# Patient Record
Sex: Female | Born: 1995 | Race: Black or African American | Hispanic: No | Marital: Single | Smoking: Never smoker
Health system: Southern US, Community
[De-identification: ages and names within clinical notes are randomized; demographics above are authoritative.]

## PROBLEM LIST (undated history)

## (undated) ENCOUNTER — Ambulatory Visit (HOSPITAL_COMMUNITY): Admission: EM | Payer: MEDICAID

## (undated) DIAGNOSIS — A599 Trichomoniasis, unspecified: Secondary | ICD-10-CM

## (undated) DIAGNOSIS — R87629 Unspecified abnormal cytological findings in specimens from vagina: Secondary | ICD-10-CM

## (undated) DIAGNOSIS — R519 Headache, unspecified: Secondary | ICD-10-CM

## (undated) DIAGNOSIS — R0600 Dyspnea, unspecified: Secondary | ICD-10-CM

## (undated) DIAGNOSIS — D649 Anemia, unspecified: Secondary | ICD-10-CM

## (undated) DIAGNOSIS — J45909 Unspecified asthma, uncomplicated: Secondary | ICD-10-CM

## (undated) DIAGNOSIS — A749 Chlamydial infection, unspecified: Secondary | ICD-10-CM

## (undated) HISTORY — PX: WISDOM TOOTH EXTRACTION: SHX21

---

## 2015-11-20 ENCOUNTER — Emergency Department (HOSPITAL_COMMUNITY)
Admission: EM | Admit: 2015-11-20 | Discharge: 2015-11-20 | Discharge status: Absent without leave | Payer: Self-pay | Source: Home / Self Care

## 2016-07-14 ENCOUNTER — Ambulatory Visit (HOSPITAL_COMMUNITY)
Admission: EM | Admit: 2016-07-14 | Discharge: 2016-07-14 | Disposition: A | Discharge status: Home / Self Care | Payer: 59 | Attending: Family Medicine | Admitting: Family Medicine

## 2016-07-14 ENCOUNTER — Encounter (HOSPITAL_COMMUNITY): Payer: Self-pay | Admitting: Emergency Medicine

## 2016-07-14 DIAGNOSIS — E860 Dehydration: Secondary | ICD-10-CM

## 2016-07-14 HISTORY — DX: Unspecified asthma, uncomplicated: J45.909

## 2016-07-14 LAB — POCT URINALYSIS DIP (DEVICE)
Glucose, UA: NEGATIVE mg/dL
Hgb urine dipstick: NEGATIVE
LEUKOCYTES UA: NEGATIVE
NITRITE: NEGATIVE
Protein, ur: 30 mg/dL — AB
Specific Gravity, Urine: 1.025 (ref 1.005–1.030)
Urobilinogen, UA: >=8 mg/dL (ref 0.0–1.0)
pH: 6 (ref 5.0–8.0)

## 2016-07-14 LAB — POCT I-STAT, CHEM 8
BUN: 7 mg/dL (ref 6–20)
CHLORIDE: 100 mmol/L — AB (ref 101–111)
Calcium, Ion: 1.19 mmol/L (ref 1.15–1.40)
Creatinine, Ser: 0.5 mg/dL (ref 0.44–1.00)
Glucose, Bld: 96 mg/dL (ref 65–99)
HEMATOCRIT: 40 % (ref 36.0–46.0)
HEMOGLOBIN: 13.6 g/dL (ref 12.0–15.0)
POTASSIUM: 3.5 mmol/L (ref 3.5–5.1)
Sodium: 139 mmol/L (ref 135–145)
TCO2: 25 mmol/L (ref 0–100)

## 2016-07-14 LAB — POCT PREGNANCY, URINE: PREG TEST UR: NEGATIVE

## 2016-07-14 NOTE — ED Notes (Signed)
Handed to dr Jarvis Newcomergrunz

## 2016-07-14 NOTE — Discharge Instructions (Signed)
You probably fainted due to dehydration. Your EKG is normal and other lab work was negative, so it is important for you to maintain good hydration status and follow up if this occurs again, especially if you have an episode during exertion.

## 2016-07-14 NOTE — ED Triage Notes (Signed)
Patient reports a syncopal episode today while in clinical.  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  Patient says there was nothing going on: no procedure, task, etc.  Patient says she hyperventilated prior to passing out.  Patient says she ate breakfast this morning: eggs, bacon, fruit cup.  Patient reports nausea and vomiting all day yesterday.    Patient stopped depo shots in December 2017.  Patient has not had a period since then.  Patient is sexually active and reports she uses condoms consistently.

## 2016-07-14 NOTE — ED Provider Notes (Signed)
[facility]    CSN: [id number] Arrival date & time: 07/14/16  1011  First Provider Contact:  First MD Initiated Contact with Patient 07/14/16 1107  History   Chief Complaint Chief Complaint  Patient presents with  . Abdominal Pain  . Loss of Consciousness   HPI Veronica Tran is a 20 y.o. female presenting with fainting spells.   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 She was about to walk into a room and felt lightheaded, hyperventilated and lost consciousness momentarily. She fell onto the floor slowly without hard contact with the ground because someone caught her. No seizure like activity, urinary incontinence, tongue biting, or postictal symptoms. She denies palpitations, dyspnea, chest pain. She had some nausea and poor po with one episode of emesis yesterday and thinks she might be dehydrated.   She's had 2 similar episodes previously over the past few months, both in the setting of poor po, none during exertion. No family history of sudden cardiac death or premature CV disease. No personal history of arrhythmia or congenital heart disease.   Past Medical History:  Diagnosis Date  . Asthma    There are no active problems to display for this patient.  History reviewed. No pertinent surgical history.  OB History    No data available     Home Medications    Prior to Admission medications   Not on File   Family History Family History  Problem Relation Age of Onset  . Hypertension Other   . Asthma Other    Social History Social History  Substance Use Topics  . Smoking status: Never Smoker  . Smokeless tobacco: Never Used  . Alcohol use No   Allergies   Review of patient's allergies indicates no known allergies.  Review of Systems Review of Systems Per HPI  Physical Exam Triage Vital Signs ED Triage Vitals  Enc Vitals Group     BP 07/14/16 1109 112/74     Pulse Rate 07/14/16 1109 94   Resp 07/14/16 1109 16     Temp 07/14/16 1109 98.3 F (36.8 C)     Temp Source 07/14/16 1109 Oral     SpO2 07/14/16 1109 100 %     Weight --      Height --      Head Circumference --      Peak Flow --      Pain Score 07/14/16 1128 4     Pain Loc --      Pain Edu? --      Excl. in GC? --    No data found.   Updated Vital Signs BP 112/74 (BP Location: Left Arm)   Pulse 94   Temp 98.3 F (36.8 C) (Oral)   Resp 16   LMP 10/14/2015 Comment: need review  SpO2 100%   Visual Acuity Right Eye Distance:   Left Eye Distance:   Bilateral Distance:    Right Eye Near:   Left Eye Near:    Bilateral Near:     Physical Exam  Constitutional: She is oriented to person, place, and time. She appears well-developed and well-nourished. No distress.  HENT:  Head: Normocephalic and atraumatic.  Mouth/Throat: No oropharyngeal exudate.  tacky mucous membranes  Eyes: Conjunctivae are normal. Pupils are equal, round, and reactive to light.  Neck: Normal range of motion. Neck supple. No JVD present.  Cardiovascular: Normal rate, regular rhythm, normal heart sounds and intact distal  pulses.  Exam reveals no gallop and no friction rub.   No murmur heard. Pulmonary/Chest: Effort normal and breath sounds normal. No respiratory distress.  Abdominal: Soft. Bowel sounds are normal. She exhibits no distension. There is no tenderness.  Musculoskeletal: She exhibits no edema or deformity.  Lymphadenopathy:    She has no cervical adenopathy.  Neurological: She is alert and oriented to person, place, and time. No cranial nerve deficit. She exhibits normal muscle tone.  Skin: Skin is warm. Capillary refill takes less than 2 seconds.  Psychiatric: She has a normal mood and affect. Her behavior is normal.     UC Treatments / Results  Labs (all labs ordered are listed, but only abnormal results are displayed) Labs Reviewed  POCT URINALYSIS DIP (DEVICE) - Abnormal; Notable for the following:        Result Value   Bilirubin Urine SMALL (*)    Ketones, ur TRACE (*)    Protein, ur 30 (*)    All other components within normal limits  POCT I-STAT, CHEM 8 - Abnormal; Notable for the following:    Chloride 100 (*)    All other components within normal limits  POCT PREGNANCY, URINE   EKG  EKG Interpretation None      ECG performed, NSR 90bpm with normal axis and intervals, normal RWP without LVH. T waves concordant without ST-T changes.   Radiology No results found.  Procedures Procedures (including critical care time)  Medications Ordered in UC Medications - No data to display   Initial Impression / Assessment and Plan / UC Course  I have reviewed the triage vital signs and the nursing notes.  Pertinent labs & imaging results that were available during my care of the patient were reviewed by me and considered in my medical decision making (see chart for details).   Final Clinical Impressions(s) / UC Diagnoses   Final diagnoses:  Dehydration   Previously healthy 20 y.o. female with fainting spell in setting of poor po consistent with dehydration vs. vasovagal syncope.   Ketones in urine and light-headedness with sitting from laying on exam consistent with dehydration. Electrolytes, Cr normal, no anemia. UPT neg. Cardiac exam normal, ECG reviewed without evidence of accessory pathway or changes indicative of HOCM.  Neg FH SCD.  - Advised to maintain hydration and return if symptoms recur or occur with exertion.   New Prescriptions New Prescriptions   No medications on file     Tyrone Nineyan B Jocilynn Grade, MD 07/14/16 1217

## 2018-07-21 ENCOUNTER — Other Ambulatory Visit: Payer: 59

## 2018-07-21 ENCOUNTER — Encounter: Payer: Self-pay | Admitting: Family

## 2018-07-21 ENCOUNTER — Encounter (INDEPENDENT_AMBULATORY_CARE_PROVIDER_SITE_OTHER): Payer: Self-pay

## 2018-07-21 ENCOUNTER — Ambulatory Visit: Payer: 59 | Admitting: Family

## 2018-07-21 VITALS — BP 102/78 | HR 102 | Temp 98.0°F | Ht 66.0 in | Wt 148.0 lb

## 2018-07-21 DIAGNOSIS — J45909 Unspecified asthma, uncomplicated: Secondary | ICD-10-CM | POA: Insufficient documentation

## 2018-07-21 DIAGNOSIS — J4520 Mild intermittent asthma, uncomplicated: Secondary | ICD-10-CM | POA: Diagnosis not present

## 2018-07-21 DIAGNOSIS — R300 Dysuria: Secondary | ICD-10-CM

## 2018-07-21 LAB — POC URINALSYSI DIPSTICK (AUTOMATED)
Bilirubin, UA: NEGATIVE
Glucose, UA: NEGATIVE
Ketones, UA: NEGATIVE
Nitrite, UA: NEGATIVE
PROTEIN UA: NEGATIVE
RBC UA: NEGATIVE
UROBILINOGEN UA: 0.2 U/dL
pH, UA: 5.5 (ref 5.0–8.0)

## 2018-07-21 MED ORDER — SULFAMETHOXAZOLE-TRIMETHOPRIM 800-160 MG PO TABS
1.0000 | ORAL_TABLET | Freq: Two times a day (BID) | ORAL | 0 refills | Status: DC
Start: 2018-07-21 — End: 2019-01-25

## 2018-07-21 NOTE — Addendum Note (Signed)
Addended by: Radford PaxAIRRIKIER DAVIDSON, Ashelynn Marks M on: 07/21/2018 10:53 AM   Modules accepted: Orders

## 2018-07-21 NOTE — Progress Notes (Signed)
  Veronica Tran is a 22 y.o. female with the following history as recorded in EpicCare:  Patient Active Problem List   Diagnosis Date Noted  . Childhood asthma 07/21/2018    Current Outpatient Medications  Medication Sig Dispense Refill  . albuterol (PROVENTIL HFA;VENTOLIN HFA) 108 (90 Base) MCG/ACT inhaler Inhale into the lungs every 6 (six) hours as needed.    . sulfamethoxazole-trimethoprim (BACTRIM DS,SEPTRA DS) 800-160 MG tablet Take 1 tablet by mouth 2 (two) times daily. 10 tablet 0   No current facility-administered medications for this visit.     Allergies: Patient has no known allergies.  Past Medical History:  Diagnosis Date  . Asthma     History reviewed. No pertinent surgical history.  Family History  Problem Relation Age of Onset  . Hypertension Mother   . Cancer Mother   . Asthma Father   . Asthma Sister   . Osteoarthritis Maternal Grandmother   . Diabetes Maternal Grandmother   . Hypertension Maternal Grandmother   . Osteoarthritis Paternal Grandmother   . Hypertension Paternal Grandmother   . Hypertension Paternal Grandfather   . Asthma Paternal Grandfather   . Osteoarthritis Paternal Grandfather     Social History   Tobacco Use  . Smoking status: Never Smoker  . Smokeless tobacco: Never Used  Substance Use Topics  . 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  Head: Normocephalic  and atraumatic  Lungs: Respirations unlabored; clear to auscultation bilaterally without wheeze, rales, rhonchi  CVS exam: normal rate and regular rhythm.  Abdomen: Soft; nontender; nondistended; normoactive bowel sounds; no masses or hepatosplenomegaly  Musculoskeletal: No deformities; no active joint inflammation; negative CVA tenderness Neurologic: Alert and oriented; speech intact; face symmetrical; moves all extremities well; CNII-XII intact without focal deficit   Assessment:  1. Dysuria   2. 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    Return for pap smear/ flu/ PPD.  Orders Placed This Encounter  Procedures  . POCT Urinalysis Dipstick (Automated)    Requested Prescriptions   Signed Prescriptions Disp Refills  . sulfamethoxazole-trimethoprim (BACTRIM DS,SEPTRA DS) 800-160 MG tablet 10 tablet 0    Sig: Take 1 tablet by mouth 2 (two) times daily.

## 2018-07-23 LAB — CULTURE, URINE COMPREHENSIVE
MICRO NUMBER: 91132208
RESULT:: NO GROWTH
SPECIMEN QUALITY:: ADEQUATE

## 2018-07-25 ENCOUNTER — Ambulatory Visit: Payer: 59 | Admitting: Family

## 2018-07-26 ENCOUNTER — Encounter: Payer: Self-pay | Admitting: Family

## 2018-07-26 ENCOUNTER — Ambulatory Visit (INDEPENDENT_AMBULATORY_CARE_PROVIDER_SITE_OTHER): Payer: 59 | Admitting: Family

## 2018-07-26 VITALS — BP 104/78 | HR 93 | Temp 98.1°F | Ht 66.0 in | Wt 143.6 lb

## 2018-07-26 DIAGNOSIS — Z111 Encounter for screening for respiratory tuberculosis: Secondary | ICD-10-CM | POA: Diagnosis not present

## 2018-07-26 DIAGNOSIS — Z23 Encounter for immunization: Secondary | ICD-10-CM

## 2018-07-26 DIAGNOSIS — Z3042 Encounter for surveillance of injectable contraceptive: Secondary | ICD-10-CM | POA: Diagnosis not present

## 2018-07-26 MED ORDER — MEDROXYPROGESTERONE ACETATE 150 MG/ML IM SUSP: 150.0000 mg | Freq: Once | INTRAMUSCULAR | 0 refills | Status: DC

## 2018-07-26 NOTE — Addendum Note (Signed)
Addended by: Karma Ganja on: 07/26/2018 03:03 PM   Modules accepted: Orders

## 2018-07-26 NOTE — Progress Notes (Signed)
  Veronica Tran is a 22 y.o. female with the following history as recorded in EpicCare:  Patient Active Problem List   Diagnosis Date Noted  . Childhood asthma 07/21/2018    Current Outpatient Medications  Medication Sig Dispense Refill  . albuterol (PROVENTIL HFA;VENTOLIN HFA) 108 (90 Base) MCG/ACT inhaler Inhale into the lungs every 6 (six) hours as needed.    . sulfamethoxazole-trimethoprim (BACTRIM DS,SEPTRA DS) 800-160 MG tablet Take 1 tablet by mouth 2 (two) times daily. 10 tablet 0  . medroxyPROGESTERone (DEPO-PROVERA) 150 MG/ML injection Inject 1 mL (150 mg total) into the muscle once for 1 dose. 1 mL 0   No current facility-administered medications for this visit.     Allergies: Patient has no known allergies.  Past Medical History:  Diagnosis Date  . Asthma     History reviewed. No pertinent surgical history.  Family History  Problem Relation Age of Onset  . Hypertension Mother   . Cancer Mother   . Asthma Father   . Asthma Sister   . Osteoarthritis Maternal Grandmother   . Diabetes Maternal Grandmother   . Hypertension Maternal Grandmother   . Osteoarthritis Paternal Grandmother   . Hypertension Paternal Grandmother   . Hypertension Paternal Grandfather   . Asthma Paternal Grandfather   . Osteoarthritis Paternal Grandfather     Social History   Tobacco Use  . Smoking status: Never Smoker  . Smokeless tobacco: Never Used  Substance Use Topics  . Alcohol use: No    Subjective:  Patient was originally scheduled for pap smear today in order to start her Depo; notes that her period started today- would prefer to do pap on Friday; Needs flu and PPD today; will return on Friday for PPD read;   Objective:  Vitals:   07/26/18 1331  BP: 104/78  Pulse: 93  Temp: 98.1 F (36.7 C)  TempSrc: Oral  SpO2: 99%  Weight: 143 lb 9.6 oz (65.1 kg)  Height: 5\' 6"  (1.676 m)    General: Well developed, well nourished, in no acute distress  Skin : Warm and dry.  Head:  Normocephalic and atraumatic  Lungs: Respirations unlabored; clear to auscultation bilaterally without wheeze, rales, rhonchi  Neurologic: Alert and oriented; speech intact; face symmetrical; moves all extremities well; CNII-XII intact without focal deficit   Assessment:  1. Encounter for Depo-Provera contraception     Plan:  Flu shot given today;  PPD placed- return in 48 hours to have read;  She will pap smear and Depo-Medrol on Friday.   Return in about 2 days (around 07/28/2018) for pap smear.  No orders of the defined types were placed in this encounter.   Requested Prescriptions   Signed Prescriptions Disp Refills  . medroxyPROGESTERone (DEPO-PROVERA) 150 MG/ML injection 1 mL 0    Sig: Inject 1 mL (150 mg total) into the muscle once for 1 dose.

## 2018-07-28 ENCOUNTER — Ambulatory Visit: Payer: 59 | Admitting: Family

## 2018-07-28 ENCOUNTER — Encounter: Payer: Self-pay | Admitting: Family

## 2018-07-28 VITALS — BP 102/70 | HR 83 | Temp 97.9°F | Ht 66.0 in | Wt 145.0 lb

## 2018-07-28 DIAGNOSIS — Z789 Other specified health status: Secondary | ICD-10-CM | POA: Diagnosis not present

## 2018-07-28 DIAGNOSIS — IMO0001 Reserved for inherently not codable concepts without codable children: Secondary | ICD-10-CM

## 2018-07-28 LAB — TB SKIN TEST
Induration: 0 mm
TB Skin Test: NEGATIVE

## 2018-07-28 MED ORDER — MEDROXYPROGESTERONE ACETATE 150 MG/ML IM SUSY
150.0000 mg | PREFILLED_SYRINGE | Freq: Once | INTRAMUSCULAR | Status: AC
  Administered 2018-07-28: 150 mg via INTRAMUSCULAR

## 2018-07-28 NOTE — Patient Instructions (Signed)
You need a pap smear before your next Depo; you will need a Depo in 3 months;

## 2018-07-28 NOTE — Progress Notes (Signed)
  Veronica Tran is a 22 y.o. female with the following history as recorded in EpicCare:  Patient Active Problem List   Diagnosis Date Noted  . Childhood asthma 07/21/2018    Current Outpatient Medications  Medication Sig Dispense Refill  . albuterol (PROVENTIL HFA;VENTOLIN HFA) 108 (90 Base) MCG/ACT inhaler Inhale into the lungs every 6 (six) hours as needed.    . medroxyPROGESTERone (DEPO-PROVERA) 150 MG/ML injection     . sulfamethoxazole-trimethoprim (BACTRIM DS,SEPTRA DS) 800-160 MG tablet Take 1 tablet by mouth 2 (two) times daily. 10 tablet 0  . medroxyPROGESTERone (DEPO-PROVERA) 150 MG/ML injection Inject 1 mL (150 mg total) into the muscle once for 1 dose. 1 mL 0   No current facility-administered medications for this visit.     Allergies: Patient has no known allergies.  Past Medical History:  Diagnosis Date  . Asthma     History reviewed. No pertinent surgical history.  Family History  Problem Relation Age of Onset  . Hypertension Mother   . Cancer Mother   . Asthma Father   . Asthma Sister   . Osteoarthritis Maternal Grandmother   . Diabetes Maternal Grandmother   . Hypertension Maternal Grandmother   . Osteoarthritis Paternal Grandmother   . Hypertension Paternal Grandmother   . Hypertension Paternal Grandfather   . Asthma Paternal Grandfather   . Osteoarthritis Paternal Grandfather     Social History   Tobacco Use  . Smoking status: Never Smoker  . Smokeless tobacco: Never Used  Substance Use Topics  . Alcohol use: No    Subjective:  Patient's appointment was originally scheduled for  1) PPD reading; 2) Depo-Provera ( currently on her period); 3) updating her pap smear; Patient notes her period is still heavier than she had planned and prefers not to update pap smear today;    Objective:  Vitals:   07/28/18 1359  BP: 102/70  Pulse: 83  Temp: 97.9 F (36.6 C)  TempSrc: Oral  SpO2: 99%  Weight: 145 lb 0.6 oz (65.8 kg)  Height: 5\' 6"  (1.676 m)     General: Well developed, well nourished, in no acute distress  Skin : Warm and dry.  Head: Normocephalic and atraumatic  Lungs: Respirations unlabored; Neurologic: Alert and oriented; speech intact; face symmetrical; moves all extremities well; CNII-XII intact without focal deficit  Assessment:  1. Birth control     Plan:  Depo-Provera 150 mg given today- patient did not bring medication with her; she understands that pap smear needs to be updated before next Depo-Provera is due;  PPD is negative;    Return in about 3 months (around 10/27/2018).  No orders of the defined types were placed in this encounter.   Requested Prescriptions    No prescriptions requested or ordered in this encounter

## 2018-08-18 ENCOUNTER — Ambulatory Visit: Payer: 59 | Admitting: Family

## 2018-08-18 DIAGNOSIS — Z0289 Encounter for other administrative examinations: Secondary | ICD-10-CM

## 2018-08-22 ENCOUNTER — Ambulatory Visit: Payer: 59 | Admitting: Family

## 2018-08-22 DIAGNOSIS — Z0289 Encounter for other administrative examinations: Secondary | ICD-10-CM

## 2018-10-23 ENCOUNTER — Ambulatory Visit: Payer: 59

## 2019-01-25 ENCOUNTER — Other Ambulatory Visit: Payer: Self-pay

## 2019-01-25 ENCOUNTER — Ambulatory Visit: Payer: 59 | Admitting: Internal Medicine

## 2019-01-25 ENCOUNTER — Telehealth: Payer: Self-pay | Admitting: *Deleted

## 2019-01-25 ENCOUNTER — Encounter: Payer: Self-pay | Admitting: Internal Medicine

## 2019-01-25 VITALS — BP 100/60 | HR 102 | Temp 98.3°F | Ht 66.0 in | Wt 144.0 lb

## 2019-01-25 DIAGNOSIS — R300 Dysuria: Secondary | ICD-10-CM | POA: Diagnosis not present

## 2019-01-25 DIAGNOSIS — Z30011 Encounter for initial prescription of contraceptive pills: Secondary | ICD-10-CM

## 2019-01-25 DIAGNOSIS — Z309 Encounter for contraceptive management, unspecified: Secondary | ICD-10-CM | POA: Insufficient documentation

## 2019-01-25 LAB — POCT URINALYSIS DIPSTICK
BILIRUBIN UA: NEGATIVE
Glucose, UA: NEGATIVE
KETONES UA: NEGATIVE
Nitrite, UA: NEGATIVE
PH UA: 6 (ref 5.0–8.0)
Protein, UA: POSITIVE — AB
Spec Grav, UA: 1.025 (ref 1.010–1.025)
UROBILINOGEN UA: 2 U/dL — AB

## 2019-01-25 LAB — POCT URINE PREGNANCY: Preg Test, Ur: NEGATIVE

## 2019-01-25 MED ORDER — LEVONORGEST-ETH ESTRAD 91-DAY 0.15-0.03 &0.01 MG PO TABS: 1.0000 | ORAL_TABLET | Freq: Every day | ORAL | 1 refills | Status: DC

## 2019-01-25 MED ORDER — NITROFURANTOIN MONOHYD MACRO 100 MG PO CAPS: 100.0000 mg | ORAL_CAPSULE | Freq: Two times a day (BID) | ORAL | 0 refills | Status: DC

## 2019-01-25 NOTE — Patient Instructions (Signed)
We have sent in birth control pills to start taking. Usually this is recommended to start when you period starts and then take every day at the same time.   You can choose to start taking now but you may notice irregular bleeding for the first several months. Use second form of protection for the first 2-3 weeks of taking birth control.   We have sent in a medication for the urine infection called macrobid. Take 1 pill twice a day for 1 week to clear the infection.    Oral Contraception Information Oral contraceptive pills (OCPs) are medicines taken to prevent pregnancy. OCPs are taken by mouth, and they work by:  Preventing the ovaries from releasing eggs.  Thickening mucus in the lower part of the uterus (cervix), which prevents sperm from entering the uterus.  Thinning the lining of the uterus (endometrium), which prevents a fertilized egg from attaching to the endometrium. OCPs are highly effective when taken exactly as prescribed. However, OCPs do not prevent STIs (sexually transmitted infections). Safe sex practices, such as using condoms while on an OCP, can help prevent STIs. Before starting OCPs Before you start taking OCPs, you may have a physical exam, blood test, and Pap test. However, you are not required to have a pelvic exam in order to be prescribed OCPs. Your health care provider will make sure you are a good candidate for oral contraception. OCPs are not a good option for certain women, including women who smoke and are older than 35 years, and women with a medical history of high blood pressure, deep vein thrombosis, pulmonary embolism, stroke, cardiovascular disease, or peripheral vascular disease. Discuss with your health care provider the possible side effects of the OCP you may be prescribed. When you start an OCP, be aware that it can take 2-3 months for your body to adjust to changes in hormone levels. Follow instructions from your health care provider about how to start  taking your first cycle of OCPs. Depending on when you start the pill, you may need to use a backup form of birth control, such as condoms, during the first week. Make sure you know what steps to take if you ever forget to take the pill. Types of oral contraception  The most common types of birth control pills contain the hormones estrogen and progestin (synthetic progesterone) or progestin only. The combination pill This type of pill contains estrogen and progestin hormones. Combination pills often come in packs of 21, 28, or 91 pills. For each pack, the last 7 pills may not contain hormones, which means you may stop taking the pills for 7 days. Menstrual bleeding occurs during the week that you do not take the pills or that you take the pills with no hormones in them. The minipill This type of pill contains the progestin hormone only. It comes in packs of 28 pills. All 28 pills contain the hormone. You take the pill every day. It is very important to take the pill at the same time each day. Advantages of oral contraceptive pills  Provides reliable and continuous contraception if taken as instructed.  May treat or decrease symptoms of: ? Menstrual period cramps. ? Irregular menstrual cycle or bleeding. ? Heavy menstrual flow. ? Abnormal uterine bleeding. ? Acne, depending on the type of pill. ? Polycystic ovarian syndrome. ? Endometriosis. ? Iron deficiency anemia. ? Premenstrual symptoms, including premenstrual dysphoric disorder.  May reduce the risk of endometrial and ovarian cancer.  Can be used as emergency  contraception.  Prevents mislocated (ectopic) pregnancies and infections of the fallopian tubes. Things that can make oral contraceptive pills less effective OCPs can be less effective if:  You forget to take the pill at the same time every day. This is especially important when taking the minipill.  You have a stomach or intestinal disease that reduces your body's ability  to absorb the pill.  You take OCPs with other medicines that make OCPs less effective, such as antibiotics, certain HIV medicines, and some seizure medicines.  You take expired OCPs.  You forget to restart the pill on day 7, if using the packs of 21 pills. Risks associated with oral contraceptive pills Oral contraceptive pills can sometimes cause side effects, such as:  Headache.  Depression.  Trouble sleeping.  Nausea and vomiting.  Breast tenderness.  Irregular bleeding or spotting during the first several months.  Bloating or fluid retention.  Increase in blood pressure. Combination pills are also associated with a small increase in the risk of:  Blood clots.  Heart attack.  Stroke. Summary  Oral contraceptive pills are medicines taken by mouth to prevent pregnancy. They are highly effective when taken exactly as prescribed.  The most common types of birth control pills contain the hormones estrogen and progestin (synthetic progesterone) or progestin only.  Before you start taking the pill, you may have a physical exam, blood test, and Pap test. Your health care provider will make sure you are a good candidate for oral contraception.  The combination pill may come in a 21-day pack, a 28-day pack, or a 91-day pack. The minipill contains the progesterone hormone only and comes in packs of 28 pills.  Oral contraceptive pills can sometimes cause side effects, such as headache, nausea, breast tenderness, or irregular bleeding. This information is not intended to replace advice given to you by your health care provider. Make sure you discuss any questions you have with your health care provider. Document Released: 01/08/2003 Document Revised: 01/11/2017 Document Reviewed: 01/11/2017 Elsevier Interactive Patient Education  2019 [employer].

## 2019-01-25 NOTE — Assessment & Plan Note (Signed)
Pregnancy test done in office since 2 weeks since cycle and not using protection. Advised of start timeline as well as need to take daily for birth control. Advised that this does not protect from other STDs. She elects to take continuous active ingredient.

## 2019-01-25 NOTE — Progress Notes (Signed)
   Subjective:   Patient ID: Veronica Tran, female    DOB: Mar 18, 1996, 23 y.o.   MRN: [id number]  HPI The patient is a 23 YO female coming in for concerns about urgency with urination. Started a couple of weeks ago and has been off and on. LMP 2 weeks ago and was on depo-provera for birth control and last injection in September and not doing anything for birth control since that time and is sexually active. She does also want to start new birth control and would prefer a daily pill. She has taken oral birth control pills earlier in life more to regulate cycle without problems. Denies fevers or chills. Denies abdominal pain. Mild side pain which worried her. Denies nausea or vomiting.   Review of Systems  Constitutional: Negative.   Respiratory: Negative.   Cardiovascular: Negative.   Gastrointestinal: Positive for abdominal pain. Negative for abdominal distention, constipation, diarrhea, nausea and vomiting.  Genitourinary: Positive for dysuria, frequency and urgency.  Musculoskeletal: Negative.   Skin: Negative.     Objective:  Physical Exam Constitutional:      Appearance: She is well-developed.  HENT:     Head: Normocephalic and atraumatic.  Neck:     Musculoskeletal: Normal range of motion.  Cardiovascular:     Rate and Rhythm: Normal rate and regular rhythm.  Pulmonary:     Effort: Pulmonary effort is normal. No respiratory distress.     Breath sounds: Normal breath sounds. No wheezing or rales.  Abdominal:     General: Bowel sounds are normal. There is no distension.     Palpations: Abdomen is soft.     Tenderness: There is abdominal tenderness. There is no rebound.     Comments: Mild right flank tenderness  Skin:    General: Skin is warm and dry.  Neurological:     Mental Status: She is alert and oriented to person, place, and time.     Coordination: Coordination normal.     Vitals:   01/25/19 1530  BP: 100/60  Pulse: (!) 102  Temp: 98.3 F (36.8 C)  TempSrc:  Oral  SpO2: 97%  Weight: 144 lb (65.3 kg)  Height: 5\' 6"  (1.676 m)    Assessment & Plan:  Visit time 25 minutes: greater than 50% of that time was spent in face to face counseling and coordination of care with the patient: counseled about birth control options as well as urinary tract infection and symptoms

## 2019-01-25 NOTE — Telephone Encounter (Signed)
Called/screened patient re: 01/25/19 OV with Dr. Okey Dupre and due to COVID-19 pandemic.  Pt denies cough, SOB, fever, N&V, diarrhea,  HA. I advised pt to keep OV today and come alone. Pt verbalized understanding.

## 2019-01-26 DIAGNOSIS — R300 Dysuria: Secondary | ICD-10-CM | POA: Insufficient documentation

## 2019-01-26 NOTE — Assessment & Plan Note (Signed)
U/A and pregnancy test done in the office which were positive for signs of infection. Negative for pregnancy. Rx for macrobid for infection.

## 2019-03-09 ENCOUNTER — Encounter: Payer: Self-pay | Admitting: Internal Medicine

## 2019-03-09 ENCOUNTER — Ambulatory Visit (INDEPENDENT_AMBULATORY_CARE_PROVIDER_SITE_OTHER): Payer: 59 | Admitting: Internal Medicine

## 2019-03-09 ENCOUNTER — Other Ambulatory Visit: Payer: Self-pay

## 2019-03-09 DIAGNOSIS — R300 Dysuria: Secondary | ICD-10-CM

## 2019-03-09 DIAGNOSIS — Z30011 Encounter for initial prescription of contraceptive pills: Secondary | ICD-10-CM

## 2019-03-09 MED ORDER — SULFAMETHOXAZOLE-TRIMETHOPRIM 800-160 MG PO TABS: 1.0000 | ORAL_TABLET | Freq: Two times a day (BID) | ORAL | 0 refills | Status: DC

## 2019-03-09 MED ORDER — NORGESTIM-ETH ESTRAD TRIPHASIC 0.18/0.215/0.25 MG-25 MCG PO TABS: 1.0000 | ORAL_TABLET | Freq: Every day | ORAL | 3 refills | Status: DC

## 2019-03-09 NOTE — Progress Notes (Signed)
Virtual Visit via Video Note  I connected with Veronica Tran on 03/09/19 at  3:40 PM EDT by a video enabled telemedicine application and verified that I am speaking with the correct person using two identifiers.  The patient and the provider were at separate locations throughout the entire encounter.   I discussed the limitations of evaluation and management by telemedicine and the availability of in person appointments. The patient expressed understanding and agreed to proceed.  History of Present Illness: The patient is a 23 y.o. female with visit for possible UTI. Recently treated for UTI back in late March. She took macrobid and symptoms went all the way away. LMP 01/17/19. Started in the last 2 weeks and the symptoms are intermittent with pain on going. Denies discharge or blood in urine. No stomach or back pain. Denies fevers or chills or nausea or vomiting. Overall it is stable. Has tried drinking more fluids. No new sexual partners. She has no known risks for STDs but not using any protection and is sexually active.  Observations/Objective: Appearance: normal, breathing appears normal, casual grooming, abdomen does not appear distended, throat normal, mental status is A and O times 3  Assessment and Plan: See problem oriented charting  Follow Up Instructions: rx bactrim and alternate OCP, asked to take home pregnancy test prior to initiation of OCP  I discussed the assessment and treatment plan with the patient. The patient was provided an opportunity to ask questions and all were answered. The patient agreed with the plan and demonstrated an understanding of the instructions.   The patient was advised to call back or seek an in-person evaluation if the symptoms worsen or if the condition fails to improve as anticipated.  Myrlene Broker, MD

## 2019-03-09 NOTE — Assessment & Plan Note (Signed)
She did not start due to cost. No birth control since that time. Denies risk of pregnancy but LMP 01/17/19 so asked her to do home pregnancy test prior to start. Having irregular cycles due to recently stopping depo-provera.

## 2019-03-09 NOTE — Assessment & Plan Note (Signed)
Rx for bactrim and if no relief needs U/A and culture and consideration for STD screening as no protection as well as urine pregnancy.

## 2019-04-20 ENCOUNTER — Other Ambulatory Visit (INDEPENDENT_AMBULATORY_CARE_PROVIDER_SITE_OTHER): Payer: 59

## 2019-04-20 ENCOUNTER — Ambulatory Visit (INDEPENDENT_AMBULATORY_CARE_PROVIDER_SITE_OTHER): Payer: 59 | Admitting: Family

## 2019-04-20 ENCOUNTER — Other Ambulatory Visit: Payer: Self-pay

## 2019-04-20 ENCOUNTER — Encounter: Payer: Self-pay | Admitting: Family

## 2019-04-20 VITALS — BP 104/68 | HR 112 | Temp 98.2°F | Ht 66.0 in | Wt 146.0 lb

## 2019-04-20 DIAGNOSIS — Z113 Encounter for screening for infections with a predominantly sexual mode of transmission: Secondary | ICD-10-CM

## 2019-04-20 DIAGNOSIS — R102 Pelvic and perineal pain: Secondary | ICD-10-CM

## 2019-04-20 DIAGNOSIS — N912 Amenorrhea, unspecified: Secondary | ICD-10-CM

## 2019-04-20 LAB — CBC WITH DIFFERENTIAL/PLATELET
Basophils Absolute: 0.1 10*3/uL (ref 0.0–0.1)
Basophils Relative: 0.5 % (ref 0.0–3.0)
Eosinophils Absolute: 0.1 10*3/uL (ref 0.0–0.7)
Eosinophils Relative: 0.8 % (ref 0.0–5.0)
HCT: 35.1 % — ABNORMAL LOW (ref 36.0–46.0)
Hemoglobin: 11.5 g/dL — ABNORMAL LOW (ref 12.0–15.0)
Lymphocytes Relative: 22.5 % (ref 12.0–46.0)
Lymphs Abs: 2.5 10*3/uL (ref 0.7–4.0)
MCHC: 32.7 g/dL (ref 30.0–36.0)
MCV: 83.9 fl (ref 78.0–100.0)
Monocytes Absolute: 0.9 10*3/uL (ref 0.1–1.0)
Monocytes Relative: 8.2 % (ref 3.0–12.0)
Neutro Abs: 7.6 10*3/uL (ref 1.4–7.7)
Neutrophils Relative %: 68 % (ref 43.0–77.0)
Platelets: 271 10*3/uL (ref 150.0–400.0)
RBC: 4.18 Mil/uL (ref 3.87–5.11)
RDW: 13.6 % (ref 11.5–15.5)
WBC: 11.1 10*3/uL — ABNORMAL HIGH (ref 4.0–10.5)

## 2019-04-20 LAB — COMPREHENSIVE METABOLIC PANEL
ALT: 45 U/L — ABNORMAL HIGH (ref 0–35)
AST: 22 U/L (ref 0–37)
Albumin: 4.2 g/dL (ref 3.5–5.2)
Alkaline Phosphatase: 81 U/L (ref 39–117)
BUN: 8 mg/dL (ref 6–23)
CO2: 25 mEq/L (ref 19–32)
Calcium: 9.2 mg/dL (ref 8.4–10.5)
Chloride: 106 mEq/L (ref 96–112)
Creatinine, Ser: 0.65 mg/dL (ref 0.40–1.20)
GFR: 136.22 mL/min (ref >60.00)
Glucose, Bld: 82 mg/dL (ref 70–99)
Potassium: 3.4 mEq/L — ABNORMAL LOW (ref 3.5–5.1)
Sodium: 138 mEq/L (ref 135–145)
Total Bilirubin: 0.4 mg/dL (ref 0.2–1.2)
Total Protein: 7 g/dL (ref 6.0–8.3)

## 2019-04-20 LAB — TSH: TSH: 0.85 u[IU]/mL (ref 0.35–4.50)

## 2019-04-20 NOTE — Progress Notes (Signed)
Veronica Tran is a 23 y.o. female with the following history as recorded in EpicCare:  Patient Active Problem List   Diagnosis Date Noted  . Dysuria 01/26/2019  . Encounter for birth control 01/25/2019  . Childhood asthma 07/21/2018    Current Outpatient Medications  Medication Sig Dispense Refill  . albuterol (PROVENTIL HFA;VENTOLIN HFA) 108 (90 Base) MCG/ACT inhaler Inhale into the lungs every 6 (six) hours as needed.    . Norgestimate-Ethinyl Estradiol Triphasic 0.18/0.215/0.25 MG-25 MCG tab Take 1 tablet by mouth daily. (Patient not taking: Reported on 04/20/2019) 4 Package 3   No current facility-administered medications for this visit.     Allergies: Patient has no known allergies.  Past Medical History:  Diagnosis Date  . Asthma     History reviewed. No pertinent surgical history.  Family History  Problem Relation Age of Onset  . Hypertension Mother   . Cancer Mother   . Asthma Father   . Asthma Sister   . Osteoarthritis Maternal Grandmother   . Diabetes Maternal Grandmother   . Hypertension Maternal Grandmother   . Osteoarthritis Paternal Grandmother   . Hypertension Paternal Grandmother   . Hypertension Paternal Grandfather   . Asthma Paternal Grandfather   . Osteoarthritis Paternal Grandfather     Social History   Tobacco Use  . Smoking status: Never Smoker  . Smokeless tobacco: Never Used  Substance Use Topics  . Alcohol use: No    Subjective:  Patient presents with concerns for pelvic pain x 6 months; notes she stopped the Depo-Provera in December 2019- had a normal period in March 2020; no period since that time; requesting STD screen today- had unprotected sex 3-4 weeks; took pregnancy test last week which was negative.  Has never had a pap smear/ not under care of GYN- no prior history of ovarian cysts; notes that the pelvic pain has been more intermittent recently;     Objective:  Vitals:   04/20/19 1520  BP: 104/68  Pulse: (!) 112  Temp: 98.2 F  (36.8 C)  TempSrc: Oral  SpO2: 99%  Weight: 146 lb (66.2 kg)  Height: 5' 6"  (1.676 m)    General: Well developed, well nourished, in no acute distress  Skin : Warm and dry.  Head: Normocephalic and atraumatic  Eyes: Sclera and conjunctiva clear; pupils round and reactive to light; extraocular movements intact  Lungs: Respirations unlabored; clear to auscultation bilaterally without wheeze, rales, rhonchi  CVS exam: normal rate and regular rhythm.  Abdomen: Soft; nontender; nondistended; normoactive bowel sounds; no masses or hepatosplenomegaly  Neurologic: Alert and oriented; speech intact; face symmetrical; moves all extremities well; CNII-XII intact without focal deficit  Assessment:  1. Pelvic pressure in female   2. Amenorrhea   3. Screen for STD (sexually transmitted disease)     Plan:  Will update labs today- need to make sure patient is not pregnant; assuming labs are normal, to consider pelvic ultrasound and GYN referral; she understands to not start her OCPs until further notice; follow-up to be determined.   No follow-ups on file.  Orders Placed This Encounter  Procedures  . GC/Chlamydia Probe Amp([facility])    Standing Status:   Future    Standing Expiration Date:   04/19/2020  . Urine Culture    Standing Status:   Future    Standing Expiration Date:   04/19/2020  . hCG, serum, qualitative    Standing Status:   Future    Standing Expiration Date:   04/19/2020  .  HIV Antibody (routine testing w rflx)    Standing Status:   Future    Standing Expiration Date:   04/19/2020  . RPR    Standing Status:   Future    Standing Expiration Date:   04/19/2020  . CBC w/Diff    Standing Status:   Future    Standing Expiration Date:   04/19/2020  . Comp Met (CMET)    Standing Status:   Future    Standing Expiration Date:   04/19/2020  . TSH    Standing Status:   Future    Standing Expiration Date:   04/19/2020    Requested Prescriptions    No prescriptions requested or ordered in  this encounter

## 2019-04-23 ENCOUNTER — Other Ambulatory Visit: Payer: Self-pay | Admitting: Family

## 2019-04-23 LAB — HIV ANTIBODY (ROUTINE TESTING W REFLEX): HIV 1&2 Ab, 4th Generation: NONREACTIVE

## 2019-04-23 LAB — HCG, SERUM, QUALITATIVE: Preg, Serum: NEGATIVE

## 2019-04-23 LAB — URINE CULTURE
MICRO NUMBER:: 588454
SPECIMEN QUALITY:: ADEQUATE

## 2019-04-23 LAB — RPR: RPR Ser Ql: NONREACTIVE

## 2019-04-23 LAB — GC/CHLAMYDIA PROBE AMP
Chlamydia trachomatis, NAA: NEGATIVE
Neisseria Gonorrhoeae by PCR: NEGATIVE

## 2019-04-23 MED ORDER — MEDROXYPROGESTERONE ACETATE 10 MG PO TABS: 10.0000 mg | ORAL_TABLET | Freq: Every day | ORAL | 0 refills | Status: DC

## 2019-06-05 ENCOUNTER — Telehealth: Payer: Self-pay

## 2019-06-05 NOTE — Telephone Encounter (Signed)
Spoke with patient and results given. 

## 2019-11-12 ENCOUNTER — Encounter: Payer: Self-pay | Admitting: Family

## 2019-11-12 ENCOUNTER — Other Ambulatory Visit: Payer: Self-pay

## 2019-11-12 ENCOUNTER — Other Ambulatory Visit (HOSPITAL_COMMUNITY)
Admission: RE | Admit: 2019-11-12 | Discharge: 2019-11-12 | Disposition: A | Discharge status: Home / Self Care | Payer: 59 | Source: Ambulatory Visit | Attending: Family | Admitting: Family

## 2019-11-12 ENCOUNTER — Ambulatory Visit (INDEPENDENT_AMBULATORY_CARE_PROVIDER_SITE_OTHER): Payer: 59 | Admitting: Family

## 2019-11-12 VITALS — BP 108/62 | HR 100 | Temp 98.1°F | Ht 66.0 in | Wt 151.0 lb

## 2019-11-12 DIAGNOSIS — R87612 Low grade squamous intraepithelial lesion on cytologic smear of cervix (LGSIL): Secondary | ICD-10-CM | POA: Diagnosis not present

## 2019-11-12 DIAGNOSIS — Z1151 Encounter for screening for human papillomavirus (HPV): Secondary | ICD-10-CM | POA: Insufficient documentation

## 2019-11-12 DIAGNOSIS — Z0000 Encounter for general adult medical examination without abnormal findings: Secondary | ICD-10-CM | POA: Diagnosis not present

## 2019-11-12 DIAGNOSIS — N926 Irregular menstruation, unspecified: Secondary | ICD-10-CM

## 2019-11-12 DIAGNOSIS — D72829 Elevated white blood cell count, unspecified: Secondary | ICD-10-CM | POA: Diagnosis not present

## 2019-11-12 DIAGNOSIS — R300 Dysuria: Secondary | ICD-10-CM

## 2019-11-12 DIAGNOSIS — Z124 Encounter for screening for malignant neoplasm of cervix: Secondary | ICD-10-CM | POA: Diagnosis present

## 2019-11-12 LAB — CBC WITH DIFFERENTIAL/PLATELET
Basophils Absolute: 0.1 10*3/uL (ref 0.0–0.1)
Basophils Relative: 0.5 % (ref 0.0–3.0)
Eosinophils Absolute: 0 10*3/uL (ref 0.0–0.7)
Eosinophils Relative: 0.4 % (ref 0.0–5.0)
HCT: 37.3 % (ref 36.0–46.0)
Hemoglobin: 11.8 g/dL — ABNORMAL LOW (ref 12.0–15.0)
Lymphocytes Relative: 25 % (ref 12.0–46.0)
Lymphs Abs: 3 10*3/uL (ref 0.7–4.0)
MCHC: 31.7 g/dL (ref 30.0–36.0)
MCV: 84.5 fl (ref 78.0–100.0)
Monocytes Absolute: 0.9 10*3/uL (ref 0.1–1.0)
Monocytes Relative: 7.9 % (ref 3.0–12.0)
Neutro Abs: 7.9 10*3/uL — ABNORMAL HIGH (ref 1.4–7.7)
Neutrophils Relative %: 66.2 % (ref 43.0–77.0)
Platelets: 308 10*3/uL (ref 150.0–400.0)
RBC: 4.42 Mil/uL (ref 3.87–5.11)
RDW: 13.6 % (ref 11.5–15.5)
WBC: 11.9 10*3/uL — ABNORMAL HIGH (ref 4.0–10.5)

## 2019-11-12 LAB — POC URINALSYSI DIPSTICK (AUTOMATED)
Bilirubin, UA: NEGATIVE
Blood, UA: NEGATIVE
Glucose, UA: NEGATIVE
Ketones, UA: NEGATIVE
Leukocytes, UA: NEGATIVE
Nitrite, UA: NEGATIVE
Protein, UA: POSITIVE — AB
Spec Grav, UA: >=1.03 — AB (ref 1.010–1.025)
Urobilinogen, UA: 0.2 E.U./dL
pH, UA: 5.5 (ref 5.0–8.0)

## 2019-11-12 MED ORDER — NORGESTREL-ETHINYL ESTRADIOL 0.3-30 MG-MCG PO TABS: 1.0000 | ORAL_TABLET | Freq: Every day | ORAL | 11 refills | Status: DC

## 2019-11-12 NOTE — Progress Notes (Signed)
Veronica Tran is a 24 y.o. female with the following history as recorded in EpicCare:  Patient Active Problem List   Diagnosis Date Noted  . Dysuria 01/26/2019  . Encounter for birth control 01/25/2019  . Childhood asthma 07/21/2018    Current Outpatient Medications  Medication Sig Dispense Refill  . albuterol (PROVENTIL HFA;VENTOLIN HFA) 108 (90 Base) MCG/ACT inhaler Inhale into the lungs every 6 (six) hours as needed.    . norgestrel-ethinyl estradiol (LO/OVRAL) 0.3-30 MG-MCG tablet Take 1 tablet by mouth daily. 1 Package 11   No current facility-administered medications for this visit.    Allergies: Patient has no known allergies.  Past Medical History:  Diagnosis Date  . Asthma     History reviewed. No pertinent surgical history.  Family History  Problem Relation Age of Onset  . Hypertension Mother   . Cancer Mother   . Asthma Father   . Asthma Sister   . Osteoarthritis Maternal Grandmother   . Diabetes Maternal Grandmother   . Hypertension Maternal Grandmother   . Osteoarthritis Paternal Grandmother   . Hypertension Paternal Grandmother   . Hypertension Paternal Grandfather   . Asthma Paternal Grandfather   . Osteoarthritis Paternal Grandfather     Social History   Tobacco Use  . Smoking status: Never Smoker  . Smokeless tobacco: Never Used  Substance Use Topics  . Alcohol use: No    Subjective:  Patient presents for yearly CPE; needs to get pap smear updated today; would like to try oral OCPs; insurance would not pay for the prescription sent last spring but wants to try different medication.   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   HENT: Negative.   Eyes: Negative.   Respiratory: Negative.   Cardiovascular: Negative.   Gastrointestinal: Negative.   Genitourinary: Negative.   Musculoskeletal:  Negative.   Skin: Negative.   Neurological: Negative.   Endo/Heme/Allergies: Negative.   Psychiatric/Behavioral: Negative.        Objective:  Vitals:   11/12/19 1525  BP: 108/62  Pulse: 100  Temp: 98.1 F (36.7 C)  TempSrc: Oral  SpO2: 99%  Weight: 151 lb (68.5 kg)  Height: 5\' 6"  (1.676 m)    General: Well developed, well nourished, in no acute distress  Skin : Warm and dry.  Head: Normocephalic and atraumatic  Eyes: Sclera and conjunctiva clear; pupils round and reactive to light; extraocular movements intact  Ears: External normal; canals clear; tympanic membranes normal  Oropharynx: Pink, supple. No suspicious lesions  Neck: Supple without thyromegaly, adenopathy  Lungs: Respirations unlabored; clear to auscultation bilaterally without wheeze, rales, rhonchi  CVS exam: normal rate and regular rhythm.  Abdomen: Soft; nontender; nondistended; normoactive bowel sounds; no masses or hepatosplenomegaly  Musculoskeletal: No deformities; no active joint inflammation  Extremities: No edema, cyanosis, clubbing  Vessels: Symmetric bilaterally  Neurologic: Alert and oriented; speech intact; face symmetrical; moves all extremities well; CNII-XII intact without focal deficit  Breast exam- normal bilaterally; Pelvic exam: normal external genitalia, vulva, vagina, cervix, uterus and adnexa.  Assessment:  1. PE (physical exam), annual   2. Irregular periods   3. Leukocytosis, unspecified type   4. Cervical cancer screening   5. Dysuria     Plan:  Thin Prep pap collected; check CBC, serum hcg today; extensive lab tests were done last summer; check  U/A and urine culture; Trial of Lo-Ovral- if her insurance will not cover, she is to call her insurance to get formulary information;    This visit occurred during the SARS-CoV-2 public health emergency.  Safety protocols were in place, including screening questions prior to the visit, additional usage of staff PPE, and extensive cleaning  of exam room while observing appropriate contact time as indicated for disinfecting solutions.    No follow-ups on file.  Orders Placed This Encounter  Procedures  . Urine Culture  . hCG, serum, qualitative  . CBC w/Diff  . POCT Urinalysis Dipstick (Automated)    Requested Prescriptions   Signed Prescriptions Disp Refills  . norgestrel-ethinyl estradiol (LO/OVRAL) 0.3-30 MG-MCG tablet 1 Package 11    Sig: Take 1 tablet by mouth daily.

## 2019-11-13 LAB — HCG, SERUM, QUALITATIVE: Preg, Serum: NEGATIVE

## 2019-11-13 LAB — URINE CULTURE

## 2019-11-14 ENCOUNTER — Other Ambulatory Visit: Payer: Self-pay | Admitting: Family

## 2019-11-14 DIAGNOSIS — D72829 Elevated white blood cell count, unspecified: Secondary | ICD-10-CM

## 2019-11-14 DIAGNOSIS — R87612 Low grade squamous intraepithelial lesion on cytologic smear of cervix (LGSIL): Secondary | ICD-10-CM

## 2019-11-14 LAB — CYTOLOGY - PAP
Chlamydia: NEGATIVE
Comment: NEGATIVE
Comment: NEGATIVE
Comment: NEGATIVE
Comment: NORMAL
High risk HPV: NEGATIVE
Neisseria Gonorrhea: NEGATIVE
Trichomonas: POSITIVE — AB

## 2019-11-14 MED ORDER — METRONIDAZOLE 500 MG PO TABS: 500.0000 mg | ORAL_TABLET | Freq: Two times a day (BID) | ORAL | 0 refills | Status: DC

## 2019-11-15 ENCOUNTER — Telehealth: Payer: Self-pay | Admitting: Family

## 2019-11-15 NOTE — Telephone Encounter (Signed)
Copied from CRM [id number]. Topic: General - Other >> Nov 15, 2019 10:03 AM Fanny Bien wrote: Reason for CRM: pt called and stated that she would like a call back from a nurse regarding lab results and a referral that was sent. Please advise

## 2019-11-15 NOTE — Telephone Encounter (Signed)
Spoke with patient today. 

## 2019-11-15 NOTE — Telephone Encounter (Signed)
Copied from CRM [id number]. Topic: General - Other >> Nov 15, 2019  8:19 AM Tamela Oddi wrote: Reason for CRM: Patient would like the nurse to call her before appt. On Monday regarding if she should bring her partner to the appt. For treatment.  CB# [phone]

## 2019-11-19 ENCOUNTER — Ambulatory Visit: Payer: 59 | Admitting: Family

## 2019-11-22 ENCOUNTER — Encounter: Payer: Self-pay | Admitting: Family

## 2019-11-23 NOTE — Telephone Encounter (Signed)
   Patient wants to know how long should she wait to resume having sexual intercourse, after taking metroNIDAZOLE (FLAGYL) 500 MG tablet  Please call

## 2019-12-07 ENCOUNTER — Other Ambulatory Visit: Payer: Self-pay

## 2019-12-07 ENCOUNTER — Ambulatory Visit: Payer: 59 | Admitting: Obstetrics and Gynecology

## 2019-12-10 ENCOUNTER — Encounter: Payer: Self-pay | Admitting: Obstetrics and Gynecology

## 2019-12-10 ENCOUNTER — Ambulatory Visit: Payer: 59 | Admitting: Obstetrics and Gynecology

## 2019-12-10 ENCOUNTER — Other Ambulatory Visit: Payer: Self-pay

## 2019-12-10 VITALS — BP 118/66 | Ht 66.0 in | Wt 152.0 lb

## 2019-12-10 DIAGNOSIS — R87619 Unspecified abnormal cytological findings in specimens from cervix uteri: Secondary | ICD-10-CM | POA: Insufficient documentation

## 2019-12-10 DIAGNOSIS — R87612 Low grade squamous intraepithelial lesion on cytologic smear of cervix (LGSIL): Secondary | ICD-10-CM | POA: Diagnosis not present

## 2019-12-10 NOTE — Progress Notes (Signed)
   Veronica Tran  1996-06-30 [id number]  HPI The patient is a 24 y.o. G0P0000 who presents today for management recommendations for an abnormal Pap smear at the request of Ria Clock FNP.  The patient had her first Pap smear performed on 11/12/2019 with results indicating low-grade squamous intraepithelial lesion, high risk HPV was negative.  She had no prior history of an abnormal Pap smear since this was her first Pap.  There were also findings of trichomonas on the slide for which she was treated with Flagyl, as was as her partner.  She says her dysuria symptoms have resolved since treatment.  Past medical history,surgical history, problem list, medications, allergies, family history and social history were all reviewed and documented as reviewed in the EPIC chart.  ROS:  Feeling well. No dyspnea or chest pain on exertion.  No abdominal pain, change in bowel habits, black or bloody stools.  No urinary tract symptoms. GYN ROS: normal menses, no abnormal bleeding, pelvic pain or discharge, No neurological complaints.  Physical Exam  BP 118/66   Ht 5\' 6"  (1.676 m)   Wt 152 lb (68.9 kg)   LMP 11/29/2019   BMI 24.53 kg/m   General: Pleasant female, no acute distress, alert and oriented PELVIC EXAM: deferred   Assessment 24 yo G0 with LGSIL pap smear, negative HPV  Plan We discussed the results of the Pap smear and the ASCCP guidelines for young women age 87-24.  Following the algorithm the most appropriate next step in management will be to repeat the Pap smear cytology in 1 year.  She therefore does not require a colposcopy today.  If that next Pap smear indicates the same or better cytology result, the next step would still be repeat cytology in another year.  If the results from the next Pap smear were to indicate a high-grade abnormality, then colposcopy would be recommended at that point.  We addressed her concerns today and she has all of her questions answered to her satisfaction.     [birth year]-06-1999 MD 12/10/19

## 2019-12-10 NOTE — Patient Instructions (Signed)
Plan to repeat a Pap smear in 1 year with the health care provider of your choice

## 2019-12-14 ENCOUNTER — Other Ambulatory Visit: Payer: Self-pay

## 2019-12-14 ENCOUNTER — Other Ambulatory Visit (INDEPENDENT_AMBULATORY_CARE_PROVIDER_SITE_OTHER): Payer: 59

## 2019-12-14 DIAGNOSIS — D72829 Elevated white blood cell count, unspecified: Secondary | ICD-10-CM | POA: Diagnosis not present

## 2019-12-14 LAB — CBC WITH DIFFERENTIAL/PLATELET
Basophils Absolute: 0 10*3/uL (ref 0.0–0.1)
Basophils Relative: 0.6 % (ref 0.0–3.0)
Eosinophils Absolute: 0.1 10*3/uL (ref 0.0–0.7)
Eosinophils Relative: 1.2 % (ref 0.0–5.0)
HCT: 35.1 % — ABNORMAL LOW (ref 36.0–46.0)
Hemoglobin: 11.5 g/dL — ABNORMAL LOW (ref 12.0–15.0)
Lymphocytes Relative: 25.4 % (ref 12.0–46.0)
Lymphs Abs: 1.9 10*3/uL (ref 0.7–4.0)
MCHC: 32.8 g/dL (ref 30.0–36.0)
MCV: 83.9 fl (ref 78.0–100.0)
Monocytes Absolute: 0.9 10*3/uL (ref 0.1–1.0)
Monocytes Relative: 12.5 % — ABNORMAL HIGH (ref 3.0–12.0)
Neutro Abs: 4.5 10*3/uL (ref 1.4–7.7)
Neutrophils Relative %: 60.3 % (ref 43.0–77.0)
Platelets: 269 10*3/uL (ref 150.0–400.0)
RBC: 4.19 Mil/uL (ref 3.87–5.11)
RDW: 13.8 % (ref 11.5–15.5)
WBC: 7.4 10*3/uL (ref 4.0–10.5)

## 2020-07-17 ENCOUNTER — Other Ambulatory Visit: Payer: Self-pay

## 2020-07-17 ENCOUNTER — Inpatient Hospital Stay (HOSPITAL_COMMUNITY)
Admission: AD | Admit: 2020-07-17 | Discharge: 2020-07-17 | Disposition: A | Discharge status: Home / Self Care | Payer: 59 | Attending: Obstetrics and Gynecology | Admitting: Obstetrics and Gynecology

## 2020-07-17 ENCOUNTER — Encounter (HOSPITAL_COMMUNITY): Payer: Self-pay | Admitting: Obstetrics and Gynecology

## 2020-07-17 DIAGNOSIS — R42 Dizziness and giddiness: Secondary | ICD-10-CM | POA: Insufficient documentation

## 2020-07-17 DIAGNOSIS — R12 Heartburn: Secondary | ICD-10-CM

## 2020-07-17 DIAGNOSIS — O99891 Other specified diseases and conditions complicating pregnancy: Secondary | ICD-10-CM

## 2020-07-17 DIAGNOSIS — Z3A09 9 weeks gestation of pregnancy: Secondary | ICD-10-CM | POA: Diagnosis not present

## 2020-07-17 DIAGNOSIS — O219 Vomiting of pregnancy, unspecified: Secondary | ICD-10-CM | POA: Diagnosis present

## 2020-07-17 DIAGNOSIS — Z79899 Other long term (current) drug therapy: Secondary | ICD-10-CM | POA: Insufficient documentation

## 2020-07-17 DIAGNOSIS — O26891 Other specified pregnancy related conditions, first trimester: Secondary | ICD-10-CM

## 2020-07-17 HISTORY — DX: Headache, unspecified: R51.9

## 2020-07-17 HISTORY — DX: Anemia, unspecified: D64.9

## 2020-07-17 HISTORY — DX: Unspecified abnormal cytological findings in specimens from vagina: R87.629

## 2020-07-17 HISTORY — DX: Chlamydial infection, unspecified: A74.9

## 2020-07-17 HISTORY — DX: Trichomoniasis, unspecified: A59.9

## 2020-07-17 LAB — URINALYSIS, ROUTINE W REFLEX MICROSCOPIC
Bilirubin Urine: NEGATIVE
Glucose, UA: NEGATIVE mg/dL
Hgb urine dipstick: NEGATIVE
Ketones, ur: 80 mg/dL — AB
Leukocytes,Ua: NEGATIVE
Nitrite: NEGATIVE
Protein, ur: NEGATIVE mg/dL
Specific Gravity, Urine: 1.028 (ref 1.005–1.030)
pH: 5 (ref 5.0–8.0)

## 2020-07-17 LAB — HEMOGLOBIN AND HEMATOCRIT, BLOOD
HCT: 38.6 % (ref 36.0–46.0)
Hemoglobin: 12.3 g/dL (ref 12.0–15.0)

## 2020-07-17 LAB — POCT PREGNANCY, URINE: Preg Test, Ur: POSITIVE — AB

## 2020-07-17 MED ORDER — FAMOTIDINE IN NACL 20-0.9 MG/50ML-% IV SOLN
20.0000 mg | Freq: Once | INTRAVENOUS | Status: AC
  Administered 2020-07-17: 20 mg via INTRAVENOUS
  Filled 2020-07-17: qty 50

## 2020-07-17 MED ORDER — LACTATED RINGERS IV BOLUS
1000.0000 mL | Freq: Once | INTRAVENOUS | Status: AC
  Administered 2020-07-17: 1000 mL via INTRAVENOUS

## 2020-07-17 MED ORDER — PROMETHAZINE HCL 25 MG/ML IJ SOLN
25.0000 mg | Freq: Once | INTRAMUSCULAR | Status: AC
  Administered 2020-07-17: 25 mg via INTRAVENOUS
  Filled 2020-07-17: qty 1

## 2020-07-17 MED ORDER — METOCLOPRAMIDE HCL 10 MG PO TABS: 10.0000 mg | ORAL_TABLET | Freq: Three times a day (TID) | ORAL | 0 refills | Status: DC | PRN

## 2020-07-17 MED ORDER — FAMOTIDINE 20 MG PO TABS: 20.0000 mg | ORAL_TABLET | Freq: Every day | ORAL | 0 refills | Status: DC

## 2020-07-17 NOTE — MAU Provider Note (Signed)
Chief Complaint: Emesis and Nausea   First Provider Initiated Contact with Patient 07/17/20 1721     SUBJECTIVE HPI: Veronica Tran is a 24 y.o. G1P0000 at [redacted]w[redacted]d who presents to [facility] reporting nausea & vomiting. Symptoms started a few weeks ago & worsened this week. Currently vomiting 10+ times per day. Only able to keep grits down. Movement, foods, & liquids make symptoms worse. Can no longer keep down any fluids. Had an episode of diarrhea on Sunday. Last BM was Monday; this frequency is normal for her. Has not treated her symptoms. Also reports some episodes of dizziness. Denies syncope, CP, or SOB. Denies fever/chills, abdominal pain, or vaginal bleeding. Has her [facility] appointment with [facility] tomorrow.   Past Medical History:  Diagnosis Date  . Anemia   . Asthma    no issues in years  . Chlamydia   . Headache   . Infection    UTI  . Trichomonas infection   . Vaginal Pap smear, abnormal    f/u scheduled, not HPV   OB History  Gravida Para Term Preterm AB Living  1 0 0 0 0 0  SAB TAB Ectopic Multiple Live Births  0 0 0 0 0    # Outcome Date GA Lbr Len/2nd Weight Sex Delivery Anes PTL Lv  1 Current            Past Surgical History:  Procedure Laterality Date  . NO PAST SURGERIES     Social History   Socioeconomic History  . Marital status: Single    Spouse name: Not on file  . Number of children: Not on file  . Years of education: Not on file  . Highest education level: Not on file  Occupational History  . Not on file  Tobacco Use  . Smoking status: Never Smoker  . Smokeless tobacco: Never Used  Vaping Use  . Vaping Use: Never used  Substance and Sexual Activity  . Alcohol use: Yes    Comment: 2-3/months  . Drug use: No  . Sexual activity: Yes    Birth control/protection: Pill    Comment: 1st intercourse 24 yo-Fewer than 5 partners  Other Topics Concern  . Not on file  Social History Narrative  . Not on file   Social Determinants  of Health   Financial Resource Strain:   . Difficulty of Paying Living Expenses: Not on file  Food Insecurity:   . Worried About Programme researcher, broadcasting/film/video in the Last Year: Not on file  . Ran Out of Food in the Last Year: Not on file  Transportation Needs:   . Lack of Transportation (Medical): Not on file  . Lack of Transportation (Non-Medical): Not on file  Physical Activity:   . Days of Exercise per Week: Not on file  . Minutes of Exercise per Session: Not on file  Stress:   . Feeling of Stress : Not on file  Social Connections:   . Frequency of Communication with Friends and Family: Not on file  . Frequency of Social Gatherings with Friends and Family: Not on file  . Attends Religious Services: Not on file  . Active Member of Clubs or Organizations: Not on file  . Attends Banker Meetings: Not on file  . Marital Status: Not on file  Intimate Partner Violence:   . Fear of Current or Ex-Partner: Not on file  . Emotionally Abused: Not on file  . Physically Abused: Not on file  . Sexually  Abused: Not on file   Family History  Problem Relation Age of Onset  . Hypertension Mother   . Cancer Mother        Lymphoma  . Arthritis Mother   . Asthma Father   . Asthma Sister   . Osteoarthritis Maternal Grandmother   . Diabetes Maternal Grandmother   . Hypertension Maternal Grandmother   . Osteoarthritis Paternal Grandmother   . Hypertension Paternal Grandmother   . Hypertension Paternal Grandfather   . Asthma Paternal Grandfather   . Osteoarthritis Paternal Grandfather   . Cancer Paternal Grandfather        Stomach   No current facility-administered medications on file prior to encounter.   Current Outpatient Medications on File Prior to Encounter  Medication Sig Dispense Refill  . albuterol (PROVENTIL HFA;VENTOLIN HFA) 108 (90 Base) MCG/ACT inhaler Inhale into the lungs every 6 (six) hours as needed.    . norgestrel-ethinyl estradiol (LO/OVRAL) 0.3-30 MG-MCG tablet  Take 1 tablet by mouth daily. 1 Package 11   No Known Allergies  I have reviewed patient's Past Medical Hx, Surgical Hx, Family Hx, Social Hx, medications and allergies.   Review of Systems  Constitutional: Negative.   Gastrointestinal: Positive for nausea and vomiting. Negative for abdominal pain.  Genitourinary: Negative.   Neurological: Positive for dizziness. Negative for syncope and headaches.    OBJECTIVE Patient Vitals for the past 24 hrs:  BP Temp Temp src Pulse Resp SpO2 Height Weight  07/17/20 1936 (!) 109/57 98.9 F (37.2 C) Oral 91 -- -- -- --  07/17/20 1908 (!) 91/50 98.6 F (37 C) -- 87 16 100 % -- --  07/17/20 1712 -- -- -- -- -- 100 % -- --  07/17/20 1630 110/62 99.4 F (37.4 C) Oral 91 18 100 % -- --  07/17/20 1624 -- -- -- -- -- -- 5\' 6"  (1.676 m) 69.4 kg   Constitutional: Well-developed, well-nourished female in no acute distress.  Cardiovascular: normal rate & rhythm, no murmur Respiratory: normal rate and effort. Lung sounds clear throughout GI: Abd soft, non-tender, Pos BS x 4. No guarding or rebound tenderness MS: Extremities nontender, no edema, normal ROM Neurologic: Alert and oriented x 4.      LAB RESULTS Results for orders placed or performed during the hospital encounter of 07/17/20 (from the past 24 hour(s))  Pregnancy, urine POC     Status: Abnormal   Collection Time: 07/17/20  4:30 PM  Result Value Ref Range   Preg Test, Ur POSITIVE (A) NEGATIVE  Urinalysis, Routine w reflex microscopic Urine, Clean Catch     Status: Abnormal   Collection Time: 07/17/20  4:40 PM  Result Value Ref Range   Color, Urine YELLOW YELLOW   APPearance HAZY (A) CLEAR   Specific Gravity, Urine 1.028 1.005 - 1.030   pH 5.0 5.0 - 8.0   Glucose, UA NEGATIVE NEGATIVE mg/dL   Hgb urine dipstick NEGATIVE NEGATIVE   Bilirubin Urine NEGATIVE NEGATIVE   Ketones, ur 80 (A) NEGATIVE mg/dL   Protein, ur NEGATIVE NEGATIVE mg/dL   Nitrite NEGATIVE NEGATIVE   Leukocytes,Ua  NEGATIVE NEGATIVE  Hemoglobin and hematocrit, blood     Status: None   Collection Time: 07/17/20  6:08 PM  Result Value Ref Range   Hemoglobin 12.3 12.0 - 15.0 g/dL   HCT 07/19/20 36 - 46 %    IMAGING No results found.  MAU COURSE Orders Placed This Encounter  Procedures  . Urinalysis, Routine w reflex microscopic Urine, Clean Catch  .  Hemoglobin and hematocrit, blood  . Pregnancy, urine POC  . Discharge patient   Meds ordered this encounter  Medications  . lactated ringers bolus 1,000 mL  . famotidine (PEPCID) IVPB 20 mg premix  . promethazine (PHENERGAN) injection 25 mg  . metoCLOPramide (REGLAN) 10 MG tablet    Sig: Take 1 tablet (10 mg total) by mouth every 8 (eight) hours as needed for nausea.    Dispense:  30 tablet    Refill:  0    Order Specific Question:   Supervising Provider    Answer:   Adam Phenix [[id number]]  . famotidine (PEPCID) 20 MG tablet    Sig: Take 1 tablet (20 mg total) by mouth daily.    Dispense:  30 tablet    Refill:  0    Order Specific Question:   Supervising Provider    Answer:   Adam Phenix [[id number]]    MDM U/a with 80 of ketones. IV fluids given with phenergan & pepcid. No vomiting while in MAU. Will rx reglan & pepcid to use at home.   ASSESSMENT 1. Nausea and vomiting during pregnancy prior to [redacted] weeks gestation   2. [redacted] weeks gestation of pregnancy   3. Heartburn during pregnancy in first trimester     PLAN Discharge home in stable condition. Rx reglan & pepcid Keep appt with ob tomorrow    Follow-up Information    Ob/Gyn, [facility] Follow up.   Contact information: [address] [place] [address] [phone]              Allergies as of 07/17/2020   No Known Allergies     Medication List    STOP taking these medications   albuterol 108 (90 Base) MCG/ACT inhaler Commonly known as: VENTOLIN HFA   norgestrel-ethinyl estradiol 0.3-30 MG-MCG tablet Commonly known as: LO/OVRAL     TAKE these  medications   famotidine 20 MG tablet Commonly known as: PEPCID Take 1 tablet (20 mg total) by mouth daily.   metoCLOPramide 10 MG tablet Commonly known as: REGLAN Take 1 tablet (10 mg total) by mouth every 8 (eight) hours as needed for nausea.        Judeth Horn, NP 07/17/2020  7:38 PM

## 2020-07-17 NOTE — MAU Note (Signed)
Ongoing nausea/vomiting.  Hx of anemia.  Not been dizzy, though was seeing double at school today.  Slight cramping last night, none today.

## 2020-07-17 NOTE — MAU Note (Signed)
Presents with c/o N/V, reports can't keep anything down including water.  States vomiting blood.  Also reports feeling dizzy and seeing double.Marland Kitchen  LMP 05/14/2020.  Has first appt with [facility] OB/Gyn tomorrow.

## 2020-07-17 NOTE — Discharge Instructions (Signed)
Hyperemesis Gravidarum Hyperemesis gravidarum is a severe form of nausea and vomiting that happens during pregnancy. Hyperemesis is worse than morning sickness. It may cause you to have nausea or vomiting all day for many days. It may keep you from eating and drinking enough food and liquids, which can lead to dehydration, malnutrition, and weight loss. Hyperemesis usually occurs during the first half (the first 20 weeks) of pregnancy. It often goes away once a woman is in her second half of pregnancy. However, sometimes hyperemesis continues through an entire pregnancy. What are the causes? The cause of this condition is not known. It may be related to changes in chemicals (hormones) in the body during pregnancy, such as the high level of pregnancy hormone (human chorionic gonadotropin) or the increase in the female sex hormone (estrogen). What are the signs or symptoms? Symptoms of this condition include:  Nausea that does not go away.  Vomiting that does not allow you to keep any food down.  Weight loss.  Body fluid loss (dehydration).  Having no desire to eat, or not liking food that you have previously enjoyed. How is this diagnosed? This condition may be diagnosed based on:  A physical exam.  Your medical history.  Your symptoms.  Blood tests.  Urine tests. How is this treated? This condition is managed by controlling symptoms. This may include:  Following an eating plan. This can help lessen nausea and vomiting.  Taking prescription medicines. An eating plan and medicines are often used together to help control symptoms. If medicines do not help relieve nausea and vomiting, you may need to receive fluids through an IV at the hospital. Follow these instructions at home: Eating and drinking   Avoid the following: ? Drinking fluids with meals. Try not to drink anything during the 30 minutes before and after your meals. ? Drinking more than 1 cup of fluid at a  time. ? Eating foods that trigger your symptoms. These may include spicy foods, coffee, high-fat foods, very sweet foods, and acidic foods. ? Skipping meals. Nausea can be more intense on an empty stomach. If you cannot tolerate food, do not force it. Try sucking on ice chips or other frozen items and make up for missed calories later. ? Lying down within 2 hours after eating. ? Being exposed to environmental triggers. These may include food smells, smoky rooms, closed spaces, rooms with strong smells, warm or humid places, overly loud and noisy rooms, and rooms with motion or flickering lights. Try eating meals in a well-ventilated area that is free of strong smells. ? Quick and sudden changes in your movement. ? Taking iron pills and multivitamins that contain iron. If you take prescription iron pills, do not stop taking them unless your health care provider approves. ? Preparing food. The smell of food can spoil your appetite or trigger nausea.  To help relieve your symptoms: ? Listen to your body. Everyone is different and has different preferences. Find what works best for you. ? Eat and drink slowly. ? Eat 5-6 small meals daily instead of 3 large meals. Eating small meals and snacks can help you avoid an empty stomach. ? In the morning, before getting out of bed, eat a couple of crackers to avoid moving around on an empty stomach. ? Try eating starchy foods as these are usually tolerated well. Examples include cereal, toast, bread, potatoes, pasta, rice, and pretzels. ? Include at least 1 serving of protein with your meals and snacks. Protein options include   lean meats, poultry, seafood, beans, nuts, nut butters, eggs, cheese, and yogurt. ? Try eating a protein-rich snack before bed. Examples of a protein-rick snack include cheese and crackers or a peanut butter sandwich made with 1 slice of whole-wheat bread and 1 tsp (5 g) of peanut butter. ? Eat or suck on things that have ginger in them.  It may help relieve nausea. Add  tsp ground ginger to hot tea or choose ginger tea. ? Try drinking 100% fruit juice or an electrolyte drink. An electrolyte drink contains sodium, potassium, and chloride. ? Drink fluids that are cold, clear, and carbonated or sour. Examples include lemonade, ginger ale, lemon-lime soda, ice water, and sparkling water. ? Brush your teeth or use a mouth rinse after meals. ? Talk with your health care provider about starting a supplement of vitamin B6. General instructions  Take over-the-counter and prescription medicines only as told by your health care provider.  Follow instructions from your health care provider about eating or drinking restrictions.  Continue to take your prenatal vitamins as told by your health care provider. If you are having trouble taking your prenatal vitamins, talk with your health care provider about different options.  Keep all follow-up and pre-birth (prenatal) visits as told by your health care provider. This is important. Contact a health care provider if:  You have pain in your abdomen.  You have a severe headache.  You have vision problems.  You are losing weight.  You feel weak or dizzy. Get help right away if:  You cannot drink fluids without vomiting.  You vomit blood.  You have constant nausea and vomiting.  You are very weak.  You faint.  You have a fever and your symptoms suddenly get worse. Summary  Hyperemesis gravidarum is a severe form of nausea and vomiting that happens during pregnancy.  Making some changes to your eating habits may help relieve nausea and vomiting.  This condition may be managed with medicine.  If medicines do not help relieve nausea and vomiting, you may need to receive fluids through an IV at the hospital. This information is not intended to replace advice given to you by your health care provider. Make sure you discuss any questions you have with your health care  provider. Document Revised: 11/07/2017 Document Reviewed: 06/16/2016 Elsevier Patient Education  2020 [employer]  

## 2020-07-18 LAB — OB RESULTS CONSOLE HIV ANTIBODY (ROUTINE TESTING): HIV: NONREACTIVE

## 2020-07-18 LAB — OB RESULTS CONSOLE RUBELLA ANTIBODY, IGM: Rubella: IMMUNE

## 2020-07-18 LAB — OB RESULTS CONSOLE GC/CHLAMYDIA
Chlamydia: NEGATIVE
Gonorrhea: NEGATIVE

## 2020-07-18 LAB — OB RESULTS CONSOLE ANTIBODY SCREEN: Antibody Screen: NEGATIVE

## 2020-07-18 LAB — OB RESULTS CONSOLE HEPATITIS B SURFACE ANTIGEN: Hepatitis B Surface Ag: NEGATIVE

## 2020-07-18 LAB — OB RESULTS CONSOLE ABO/RH: RH Type: POSITIVE

## 2020-07-18 LAB — OB RESULTS CONSOLE RPR: RPR: NONREACTIVE

## 2020-08-10 ENCOUNTER — Inpatient Hospital Stay (HOSPITAL_COMMUNITY)
Admission: AD | Admit: 2020-08-10 | Discharge: 2020-08-11 | Disposition: A | Discharge status: Home / Self Care | Payer: 59 | Attending: Obstetrics and Gynecology | Admitting: Obstetrics and Gynecology

## 2020-08-10 ENCOUNTER — Encounter (HOSPITAL_COMMUNITY): Payer: Self-pay | Admitting: Obstetrics and Gynecology

## 2020-08-10 ENCOUNTER — Other Ambulatory Visit: Payer: Self-pay

## 2020-08-10 DIAGNOSIS — O26891 Other specified pregnancy related conditions, first trimester: Secondary | ICD-10-CM | POA: Insufficient documentation

## 2020-08-10 DIAGNOSIS — E876 Hypokalemia: Secondary | ICD-10-CM

## 2020-08-10 DIAGNOSIS — K117 Disturbances of salivary secretion: Secondary | ICD-10-CM | POA: Diagnosis not present

## 2020-08-10 DIAGNOSIS — O99611 Diseases of the digestive system complicating pregnancy, first trimester: Secondary | ICD-10-CM | POA: Insufficient documentation

## 2020-08-10 DIAGNOSIS — K209 Esophagitis, unspecified without bleeding: Secondary | ICD-10-CM

## 2020-08-10 DIAGNOSIS — O210 Mild hyperemesis gravidarum: Secondary | ICD-10-CM

## 2020-08-10 DIAGNOSIS — O99281 Endocrine, nutritional and metabolic diseases complicating pregnancy, first trimester: Secondary | ICD-10-CM | POA: Diagnosis not present

## 2020-08-10 DIAGNOSIS — O219 Vomiting of pregnancy, unspecified: Secondary | ICD-10-CM | POA: Diagnosis present

## 2020-08-10 DIAGNOSIS — O99511 Diseases of the respiratory system complicating pregnancy, first trimester: Secondary | ICD-10-CM | POA: Diagnosis not present

## 2020-08-10 DIAGNOSIS — J45909 Unspecified asthma, uncomplicated: Secondary | ICD-10-CM | POA: Insufficient documentation

## 2020-08-10 DIAGNOSIS — R079 Chest pain, unspecified: Secondary | ICD-10-CM | POA: Diagnosis not present

## 2020-08-10 DIAGNOSIS — Z3A12 12 weeks gestation of pregnancy: Secondary | ICD-10-CM

## 2020-08-10 HISTORY — DX: Dyspnea, unspecified: R06.00

## 2020-08-10 LAB — URINALYSIS, ROUTINE W REFLEX MICROSCOPIC
Bilirubin Urine: NEGATIVE
Glucose, UA: NEGATIVE mg/dL
Hgb urine dipstick: NEGATIVE
Ketones, ur: 80 mg/dL — AB
Leukocytes,Ua: NEGATIVE
Nitrite: NEGATIVE
Protein, ur: 30 mg/dL — AB
Specific Gravity, Urine: 1.026 (ref 1.005–1.030)
pH: 5 (ref 5.0–8.0)

## 2020-08-10 LAB — BASIC METABOLIC PANEL WITH GFR
Anion gap: 13 (ref 5–15)
BUN: 6 mg/dL (ref 6–20)
CO2: 23 mmol/L (ref 22–32)
Calcium: 9.6 mg/dL (ref 8.9–10.3)
Chloride: 101 mmol/L (ref 98–111)
Creatinine, Ser: 0.65 mg/dL (ref 0.44–1.00)
GFR, Estimated: >60 mL/min
Glucose, Bld: 92 mg/dL (ref 70–99)
Potassium: 3.2 mmol/L — ABNORMAL LOW (ref 3.5–5.1)
Sodium: 137 mmol/L (ref 135–145)

## 2020-08-10 LAB — CBC
HCT: 39.7 % (ref 36.0–46.0)
Hemoglobin: 12.9 g/dL (ref 12.0–15.0)
MCH: 26.9 pg (ref 26.0–34.0)
MCHC: 32.5 g/dL (ref 30.0–36.0)
MCV: 82.7 fL (ref 80.0–100.0)
Platelets: 338 10*3/uL (ref 150–400)
RBC: 4.8 MIL/uL (ref 3.87–5.11)
RDW: 13 % (ref 11.5–15.5)
WBC: 12.3 10*3/uL — ABNORMAL HIGH (ref 4.0–10.5)
nRBC: 0 % (ref 0.0–0.2)

## 2020-08-10 MED ORDER — LACTATED RINGERS IV BOLUS
1000.0000 mL | Freq: Once | INTRAVENOUS | Status: AC
  Administered 2020-08-10: 1000 mL via INTRAVENOUS

## 2020-08-10 MED ORDER — PROMETHAZINE HCL 25 MG PO TABS: 12.5000 mg | ORAL_TABLET | Freq: Four times a day (QID) | ORAL | 0 refills | Status: AC | PRN

## 2020-08-10 MED ORDER — PROMETHAZINE HCL 25 MG/ML IJ SOLN
25.0000 mg | Freq: Four times a day (QID) | INTRAMUSCULAR | Status: DC | PRN
  Administered 2020-08-10: 25 mg via INTRAVENOUS
  Filled 2020-08-10: qty 1

## 2020-08-10 MED ORDER — GLYCOPYRROLATE 1 MG PO TABS: 1.0000 mg | ORAL_TABLET | Freq: Three times a day (TID) | ORAL | 0 refills | Status: AC | PRN

## 2020-08-10 MED ORDER — FAMOTIDINE IN NACL 20-0.9 MG/50ML-% IV SOLN
20.0000 mg | Freq: Once | INTRAVENOUS | Status: AC
  Administered 2020-08-10: 20 mg via INTRAVENOUS
  Filled 2020-08-10: qty 50

## 2020-08-10 MED ORDER — SCOPOLAMINE 1 MG/3DAYS TD PT72: 1.0000 | MEDICATED_PATCH | TRANSDERMAL | 0 refills | Status: DC

## 2020-08-10 MED ORDER — FAMOTIDINE 20 MG PO TABS: 20.0000 mg | ORAL_TABLET | Freq: Two times a day (BID) | ORAL | 0 refills | Status: AC

## 2020-08-10 MED ORDER — SCOPOLAMINE 1 MG/3DAYS TD PT72
1.0000 | MEDICATED_PATCH | TRANSDERMAL | Status: DC
  Administered 2020-08-10: 1.5 mg via TRANSDERMAL
  Filled 2020-08-10: qty 1

## 2020-08-10 MED ORDER — GLYCOPYRROLATE 0.2 MG/ML IJ SOLN
0.1000 mg | Freq: Once | INTRAMUSCULAR | Status: AC
  Administered 2020-08-10: 0.1 mg via INTRAVENOUS
  Filled 2020-08-10: qty 1

## 2020-08-10 NOTE — Discharge Instructions (Signed)
Morning Sickness ° °Morning sickness is when you feel sick to your stomach (nauseous) during pregnancy. You may feel sick to your stomach and throw up (vomit). You may feel sick in the morning, but you can feel this way at any time of day. Some women feel very sick to their stomach and cannot stop throwing up (hyperemesis gravidarum). °Follow these instructions at home: °Medicines °· Take over-the-counter and prescription medicines only as told by your doctor. Do not take any medicines until you talk with your doctor about them first. °· Taking multivitamins before getting pregnant can stop or lessen the harshness of morning sickness. °Eating and drinking °· Eat dry toast or crackers before getting out of bed. °· Eat 5 or 6 small meals a day. °· Eat dry and bland foods like rice and baked potatoes. °· Do not eat greasy, fatty, or spicy foods. °· Have someone cook for you if the smell of food causes you to feel sick or throw up. °· If you feel sick to your stomach after taking prenatal vitamins, take them at night or with a snack. °· Eat protein when you need a snack. Nuts, yogurt, and cheese are good choices. °· Drink fluids throughout the day. °· Try ginger ale made with real ginger, ginger tea made from fresh grated ginger, or ginger candies. °General instructions °· Do not use any products that have nicotine or tobacco in them, such as cigarettes and e-cigarettes. If you need help quitting, ask your doctor. °· Use an air purifier to keep the air in your house free of smells. °· Get lots of fresh air. °· Try to avoid smells that make you feel sick. °· Try: °? Wearing a bracelet that is used for seasickness (acupressure wristband). °? Going to a doctor who puts thin needles into certain body points (acupuncture) to improve how you feel. °Contact a doctor if: °· You need medicine to feel better. °· You feel dizzy or light-headed. °· You are losing weight. °Get help right away if: °· You feel very sick to your  stomach and cannot stop throwing up. °· You pass out (faint). °· You have very bad pain in your belly. °Summary °· Morning sickness is when you feel sick to your stomach (nauseous) during pregnancy. °· You may feel sick in the morning, but you can feel this way at any time of day. °· Making some changes to what you eat may help your symptoms go away. °This information is not intended to replace advice given to you by your health care provider. Make sure you discuss any questions you have with your health care provider. °Document Revised: 09/30/2017 Document Reviewed: 11/18/2016 °Elsevier Patient Education © 2020 Elsevier Inc. ° °

## 2020-08-10 NOTE — Progress Notes (Signed)
Pt has tolerated small sips of clear liquids - pt voiced nausea has improved-po challenge per provider request with crackers. Pt agreeable

## 2020-08-10 NOTE — MAU Note (Signed)
Pt is G1P0 with an [facility] 02/18/2021. Presents with complaints: 1.stabbing pain in her chest with swallowing 2.SOB/chest pressure-without activity and increases in intensity with activity 3.N/V x 15 days reports emesis since last Thursday is brown/"clumpy" and tastes of blood 4.Cough-non productive.

## 2020-08-10 NOTE — MAU Provider Note (Addendum)
History     CSN: [id number]  Arrival date and time: 08/10/20 1749   First Provider Initiated Contact with Patient 08/10/20 1904      Chief Complaint  Patient presents with  . Nausea  . Emesis  . Headache  . Cough  . Chest Pain   24 y.o. G1 @12 .4 wks presenting with N/V, chest pain, SOB. Reports onset of N/V 2 weeks ago. She has tried Reglan and Zofran but neither help. She is unable to tolerate anything po. Saw some brown color in her vomit, tastes like blood.Reports onset of chest pain 2 days ago. Describes as burning in her throat extending down to her stomach. Pain occurs with swallowing and belching. SOB occurs when she is ambulating or lying down. She is unsure of weight loss. Denies cramping or VB. Having some soreness in abdomen from vomiting. Also reports excessive salivation and spitting all day.  OB History    Gravida  1   Para  0   Term  0   Preterm  0   AB  0   Living  0     SAB  0   TAB  0   Ectopic  0   Multiple  0   Live Births  0           Past Medical History:  Diagnosis Date  . Anemia    took iron in past - nothing current  . Asthma    no issues in years- no medications  . Chlamydia   . Dyspnea    pt reported-no acute signs of distress  . Headache   . Trichomonas infection   . Vaginal Pap smear, abnormal    f/u scheduled, not HPV    Past Surgical History:  Procedure Laterality Date  . NO PAST SURGERIES      Family History  Problem Relation Age of Onset  . Hypertension Mother   . Cancer Mother        Lymphoma  . Arthritis Mother   . Asthma Father   . Asthma Sister   . Osteoarthritis Maternal Grandmother   . Diabetes Maternal Grandmother   . Hypertension Maternal Grandmother   . Osteoarthritis Paternal Grandmother   . Hypertension Paternal Grandmother   . Hypertension Paternal Grandfather   . Asthma Paternal Grandfather   . Osteoarthritis Paternal Grandfather   . Cancer Paternal Grandfather        Stomach     Social History   Tobacco Use  . Smoking status: Never Smoker  . Smokeless tobacco: Never Used  Vaping Use  . Vaping Use: Never used  Substance Use Topics  . Alcohol use: Yes    Comment: 2-3/months  . Drug use: No    Allergies: No Known Allergies  Medications Prior to Admission  Medication Sig Dispense Refill Last Dose  . metoCLOPramide (REGLAN) 10 MG tablet Take 1 tablet (10 mg total) by mouth every 8 (eight) hours as needed for nausea. 30 tablet 0 Past Week at Unknown time  . pyridOXINE (VITAMIN B-6) 100 MG tablet Take 100 mg by mouth every 3 (three) hours while awake.   Past Week at Unknown time  . [DISCONTINUED] famotidine (PEPCID) 20 MG tablet Take 1 tablet (20 mg total) by mouth daily. 30 tablet 0     Review of Systems  Constitutional: Negative for chills and fever.  Respiratory: Positive for shortness of breath.   Cardiovascular: Positive for chest pain.  Gastrointestinal: Positive for nausea and vomiting. Negative for  abdominal pain, constipation and diarrhea.  Genitourinary: Negative for vaginal bleeding.   Physical Exam   Blood pressure 111/72, pulse (!) 106, temperature 99.2 F (37.3 C), temperature source Oral, resp. rate 18, height 5\' 6"  (1.676 m), weight 69.4 kg, last menstrual period 05/14/2020, SpO2 96 %.  Physical Exam Vitals and nursing note reviewed.  Constitutional:      General: She is not in acute distress.    Appearance: Normal appearance.  HENT:     Head: Normocephalic and atraumatic.  Pulmonary:     Effort: Pulmonary effort is normal. No respiratory distress.  Abdominal:     General: There is no distension.     Palpations: Abdomen is soft.     Tenderness: There is no abdominal tenderness.  Musculoskeletal:        General: Normal range of motion.     Cervical back: Normal range of motion.  Skin:    General: Skin is warm and dry.  Neurological:     General: No focal deficit present.     Mental Status: She is alert and oriented to  person, place, and time.  Psychiatric:        Mood and Affect: Mood normal.        Behavior: Behavior normal.   FHT 174  Results for orders placed or performed during the hospital encounter of 08/10/20 (from the past 24 hour(s))  CBC     Status: Abnormal   Collection Time: 08/10/20  7:34 PM  Result Value Ref Range   WBC 12.3 (H) 4.0 - 10.5 K/uL   RBC 4.80 3.87 - 5.11 MIL/uL   Hemoglobin 12.9 12.0 - 15.0 g/dL   HCT 10/10/20 36 - 46 %   MCV 82.7 80.0 - 100.0 fL   MCH 26.9 26.0 - 34.0 pg   MCHC 32.5 30.0 - 36.0 g/dL   RDW 54.6 27.0 - 35.0 %   Platelets 338 150 - 400 K/uL   nRBC 0.0 0.0 - 0.2 %  Basic metabolic panel     Status: Abnormal   Collection Time: 08/10/20  7:34 PM  Result Value Ref Range   Sodium 137 135 - 145 mmol/L   Potassium 3.2 (L) 3.5 - 5.1 mmol/L   Chloride 101 98 - 111 mmol/L   CO2 23 22 - 32 mmol/L   Glucose, Bld 92 70 - 99 mg/dL   BUN 6 6 - 20 mg/dL   Creatinine, Ser 10/10/20 0.44 - 1.00 mg/dL   Calcium 9.6 8.9 - 8.18 mg/dL   GFR, Estimated 29.9 >37 mL/min   Anion gap 13 5 - 15   MAU Course  Procedures LR Pepcid Robinul Phenergan Scopolamine  MDM Labs ordered and reviewed. Mild hypokalemia. IVF infusing. Needs po challenge when complete. Transfer of care given to >16, CNM  08/10/2020 9:11 PM   IV medication completed at 2235- PO challenge initiated  Reassessment around 2315, patient able to tolerate PO challenge.  Medications for home use sent by 2316 CNM  Discussed reasons to return to MAU. Follow up as scheduled in the office. Return to MAU as needed. Pt stable at time of discharge.   Assessment and Plan   1. [redacted] weeks gestation of pregnancy   2. Morning sickness   3. Ptyalism   4. Esophagitis   5. Hypokalemia    Discharge home Follow up as scheduled in the office for prenatal care Return to MAU as needed for reasons discussed and/or emergencies    Follow-up  Information    Ob/Gyn, Nestor Ramp. Go in 1  week(s).   Contact information: [address] [place] [address] [phone]              Allergies as of 08/11/2020   No Known Allergies     Medication List    STOP taking these medications   metoCLOPramide 10 MG tablet Commonly known as: REGLAN   pyridOXINE 100 MG tablet Commonly known as: VITAMIN B-6     TAKE these medications   famotidine 20 MG tablet Commonly known as: PEPCID Take 1 tablet (20 mg total) by mouth 2 (two) times daily. What changed: when to take this   glycopyrrolate 1 MG tablet Commonly known as: Robinul Take 1 tablet (1 mg total) by mouth 3 (three) times daily as needed.   promethazine 25 MG tablet Commonly known as: PHENERGAN Take 0.5-1 tablets (12.5-25 mg total) by mouth every 6 (six) hours as needed for nausea or vomiting.   scopolamine 1 MG/3DAYS Commonly known as: TRANSDERM-SCOP Place 1 patch (1.5 mg total) onto the skin every 3 (three) days. Start taking on: August 13, 2020      Sharyon Cable, [place] 08/10/20, 11:47 PM

## 2020-08-10 NOTE — Progress Notes (Signed)
Pt tolerated crackers and juice without vomiting. Pt states she is feeling better and would like to be discharged to home.

## 2020-08-14 ENCOUNTER — Encounter (HOSPITAL_COMMUNITY): Payer: Self-pay | Admitting: Obstetrics and Gynecology

## 2020-08-14 ENCOUNTER — Inpatient Hospital Stay (HOSPITAL_COMMUNITY)
Admission: AD | Admit: 2020-08-14 | Discharge: 2020-08-14 | Discharge status: Against Medical Advice | Payer: 59 | Attending: Obstetrics and Gynecology | Admitting: Obstetrics and Gynecology

## 2020-08-14 ENCOUNTER — Other Ambulatory Visit: Payer: Self-pay

## 2020-08-14 DIAGNOSIS — O99891 Other specified diseases and conditions complicating pregnancy: Secondary | ICD-10-CM

## 2020-08-14 DIAGNOSIS — O99511 Diseases of the respiratory system complicating pregnancy, first trimester: Secondary | ICD-10-CM | POA: Diagnosis not present

## 2020-08-14 DIAGNOSIS — J45909 Unspecified asthma, uncomplicated: Secondary | ICD-10-CM | POA: Diagnosis not present

## 2020-08-14 DIAGNOSIS — R079 Chest pain, unspecified: Secondary | ICD-10-CM | POA: Diagnosis not present

## 2020-08-14 DIAGNOSIS — O26891 Other specified pregnancy related conditions, first trimester: Secondary | ICD-10-CM

## 2020-08-14 DIAGNOSIS — Z5329 Procedure and treatment not carried out because of patient's decision for other reasons: Secondary | ICD-10-CM | POA: Diagnosis not present

## 2020-08-14 DIAGNOSIS — O219 Vomiting of pregnancy, unspecified: Secondary | ICD-10-CM | POA: Diagnosis present

## 2020-08-14 DIAGNOSIS — Z3A13 13 weeks gestation of pregnancy: Secondary | ICD-10-CM

## 2020-08-14 DIAGNOSIS — R12 Heartburn: Secondary | ICD-10-CM | POA: Insufficient documentation

## 2020-08-14 LAB — BASIC METABOLIC PANEL
Anion gap: 14 (ref 5–15)
BUN: 5 mg/dL — ABNORMAL LOW (ref 6–20)
CO2: 23 mmol/L (ref 22–32)
Calcium: 9.4 mg/dL (ref 8.9–10.3)
Chloride: 100 mmol/L (ref 98–111)
Creatinine, Ser: 0.61 mg/dL (ref 0.44–1.00)
GFR, Estimated: >60 mL/min (ref >60)
Glucose, Bld: 87 mg/dL (ref 70–99)
Potassium: 3.1 mmol/L — ABNORMAL LOW (ref 3.5–5.1)
Sodium: 137 mmol/L (ref 135–145)

## 2020-08-14 MED ORDER — METOCLOPRAMIDE HCL 5 MG/ML IJ SOLN
10.0000 mg | Freq: Once | INTRAMUSCULAR | Status: AC
  Administered 2020-08-14: 10 mg via INTRAVENOUS
  Filled 2020-08-14: qty 2

## 2020-08-14 MED ORDER — PANTOPRAZOLE SODIUM 40 MG IV SOLR
40.0000 mg | Freq: Once | INTRAVENOUS | Status: AC
  Administered 2020-08-14: 40 mg via INTRAVENOUS
  Filled 2020-08-14: qty 40

## 2020-08-14 MED ORDER — THIAMINE HCL 100 MG/ML IJ SOLN
Freq: Once | INTRAVENOUS | Status: DC
  Filled 2020-08-14: qty 1000

## 2020-08-14 MED ORDER — LACTATED RINGERS IV BOLUS
1000.0000 mL | Freq: Once | INTRAVENOUS | Status: AC
  Administered 2020-08-14: 1000 mL via INTRAVENOUS

## 2020-08-14 NOTE — MAU Note (Signed)
Pt left AMA before pt could provide urine sample

## 2020-08-14 NOTE — MAU Note (Signed)
Not able to urinate.  Pt only takeing promethazine and scop patch not stared pepcid or Robinol

## 2020-08-14 NOTE — MAU Note (Addendum)
Pt stated has had N/V . Emesis is "dark colored" Pt stated she has felt SOB for the past 2 days. Has taken phenergan and had a scope patch on (told to remove today).C/o pan and burning in her chest.

## 2020-08-14 NOTE — MAU Provider Note (Signed)
History     CSN: [id number]  Arrival date and time: 08/14/20 1134   First Provider Initiated Contact with Patient 08/14/20 1328      Chief Complaint  Patient presents with  . Emesis  . Chest Pain   Veronica Tran is a 24 y.o. G1P0000 at [redacted]w[redacted]d who presents with n/v & chest pain. This has been an ongoing issue with her pregnancy. Is currently taking reglan & using scop patch at home. Was recently prescribed pepcid & robinul but hasn't filled her rx due to her schedule. States she has vomited 5 times today and hasn't been able to keep down food for a week. Feels burning in her throat and chest like she did the other day when she was seen in MAU. Denies fever/chills, abdominal pain, diarrhea, or vaginal bleeding. Goes to [employer].   Location: chest, throat Quality: burning Severity: 7/10 on pain scale Duration: 1 week Timing: constant Modifying factors: worse with swallowing Associated signs and symptoms: n/v    OB History    Gravida  1   Para  0   Term  0   Preterm  0   AB  0   Living  0     SAB  0   TAB  0   Ectopic  0   Multiple  0   Live Births  0           Past Medical History:  Diagnosis Date  . Anemia    took iron in past - nothing current  . Asthma    no issues in years- no medications  . Chlamydia   . Dyspnea    pt reported-no acute signs of distress  . Headache   . Trichomonas infection   . Vaginal Pap smear, abnormal    f/u scheduled, not HPV    Past Surgical History:  Procedure Laterality Date  . NO PAST SURGERIES      Family History  Problem Relation Age of Onset  . Hypertension Mother   . Cancer Mother        Lymphoma  . Arthritis Mother   . Asthma Father   . Asthma Sister   . Osteoarthritis Maternal Grandmother   . Diabetes Maternal Grandmother   . Hypertension Maternal Grandmother   . Osteoarthritis Paternal Grandmother   . Hypertension Paternal Grandmother   . Hypertension Paternal Grandfather   . Asthma  Paternal Grandfather   . Osteoarthritis Paternal Grandfather   . Cancer Paternal Grandfather        Stomach    Social History   Tobacco Use  . Smoking status: Never Smoker  . Smokeless tobacco: Never Used  Vaping Use  . Vaping Use: Never used  Substance Use Topics  . Alcohol use: Not Currently    Comment: 2-3/months  . Drug use: No    Allergies: No Known Allergies  Medications Prior to Admission  Medication Sig Dispense Refill Last Dose  . famotidine (PEPCID) 20 MG tablet Take 1 tablet (20 mg total) by mouth 2 (two) times daily. 60 tablet 0   . glycopyrrolate (ROBINUL) 1 MG tablet Take 1 tablet (1 mg total) by mouth 3 (three) times daily as needed. 30 tablet 0   . ondansetron (ZOFRAN-ODT) 8 MG disintegrating tablet Take 8 mg by mouth every 8 (eight) hours as needed.     . promethazine (PHENERGAN) 25 MG tablet Take 0.5-1 tablets (12.5-25 mg total) by mouth every 6 (six) hours as needed for nausea or vomiting.  30 tablet 0   . scopolamine (TRANSDERM-SCOP) 1 MG/3DAYS Place 1 patch (1.5 mg total) onto the skin every 3 (three) days. 10 patch 0     Review of Systems  Constitutional: Negative.   HENT: Positive for sore throat.   Cardiovascular: Positive for chest pain.  Gastrointestinal: Positive for nausea and vomiting. Negative for abdominal pain.  Genitourinary: Negative.    Physical Exam   Blood pressure 110/69, pulse (!) 119, resp. rate 18, height 5\' 6"  (1.676 m), weight 63 kg, last menstrual period 05/14/2020, SpO2 100 %.  Physical Exam Vitals and nursing note reviewed.  Constitutional:      General: She is not in acute distress.    Appearance: She is well-developed.  Pulmonary:     Effort: Pulmonary effort is normal. No respiratory distress.  Neurological:     Mental Status: She is alert.  Psychiatric:        Mood and Affect: Mood normal.        Behavior: Behavior normal.     MAU Course  Procedures Results for orders placed or performed during the hospital  encounter of 08/14/20 (from the past 24 hour(s))  Basic metabolic panel     Status: Abnormal   Collection Time: 08/14/20  1:51 PM  Result Value Ref Range   Sodium 137 135 - 145 mmol/L   Potassium 3.1 (L) 3.5 - 5.1 mmol/L   Chloride 100 98 - 111 mmol/L   CO2 23 22 - 32 mmol/L   Glucose, Bld 87 70 - 99 mg/dL   BUN 5 (L) 6 - 20 mg/dL   Creatinine, Ser 08/16/20 0.44 - 1.00 mg/dL   Calcium 9.4 8.9 - 0.37 mg/dL   GFR, Estimated 04.8 >88 mL/min   Anion gap 14 5 - 15    MDM Patient presents with continued n/v & chest/throat burning. Has not picked up meds prescribed at previous visit. Offered IV fluids & meds. Encouraged patient to start pepcid asap or I can prescribe protonix.   IV LR bolus, banana bag, reglan, & pepcid ordered.   Patient signed out AMA after IV started due to a family emergency  Assessment and Plan   1. Nausea and vomiting during pregnancy prior to [redacted] weeks gestation   2. Heartburn during pregnancy in first trimester   3. Left against medical advice   4. [redacted] weeks gestation of pregnancy      >91 08/14/2020, 1:28 PM

## 2020-08-14 NOTE — MAU Note (Signed)
Pt pushed call bell and RN went into room pt stating that her mother called her and that there is a family emergency and she needs to leave now.   Provider Estanislado Spire, NP made aware.   RN removed IV and pt signed AMA form.

## 2020-08-27 ENCOUNTER — Other Ambulatory Visit: Payer: Self-pay | Admitting: Obstetrics

## 2020-08-27 DIAGNOSIS — Q513 Bicornate uterus: Secondary | ICD-10-CM

## 2020-09-15 ENCOUNTER — Ambulatory Visit
Admission: RE | Admit: 2020-09-15 | Discharge: 2020-09-15 | Disposition: A | Discharge status: Home / Self Care | Payer: 59 | Source: Ambulatory Visit | Attending: Obstetrics | Admitting: Obstetrics

## 2020-09-15 DIAGNOSIS — Q513 Bicornate uterus: Secondary | ICD-10-CM

## 2020-12-29 ENCOUNTER — Other Ambulatory Visit: Payer: Self-pay | Admitting: Obstetrics

## 2020-12-29 DIAGNOSIS — Z363 Encounter for antenatal screening for malformations: Secondary | ICD-10-CM

## 2020-12-29 DIAGNOSIS — O365990 Maternal care for other known or suspected poor fetal growth, unspecified trimester, not applicable or unspecified: Secondary | ICD-10-CM

## 2021-01-02 ENCOUNTER — Ambulatory Visit: Payer: 59 | Admitting: *Deleted

## 2021-01-02 ENCOUNTER — Encounter: Payer: Self-pay | Admitting: *Deleted

## 2021-01-02 ENCOUNTER — Ambulatory Visit: Payer: 59 | Attending: Obstetrics and Gynecology

## 2021-01-02 ENCOUNTER — Other Ambulatory Visit: Payer: Self-pay | Admitting: Obstetrics and Gynecology

## 2021-01-02 ENCOUNTER — Other Ambulatory Visit: Payer: Self-pay

## 2021-01-02 VITALS — BP 112/67 | HR 104

## 2021-01-02 DIAGNOSIS — Q513 Bicornate uterus: Secondary | ICD-10-CM

## 2021-01-02 DIAGNOSIS — O365930 Maternal care for other known or suspected poor fetal growth, third trimester, not applicable or unspecified: Secondary | ICD-10-CM

## 2021-01-02 DIAGNOSIS — Z363 Encounter for antenatal screening for malformations: Secondary | ICD-10-CM | POA: Insufficient documentation

## 2021-01-02 DIAGNOSIS — O365990 Maternal care for other known or suspected poor fetal growth, unspecified trimester, not applicable or unspecified: Secondary | ICD-10-CM | POA: Insufficient documentation

## 2021-01-02 DIAGNOSIS — Z3A33 33 weeks gestation of pregnancy: Secondary | ICD-10-CM

## 2021-01-02 DIAGNOSIS — O34593 Maternal care for other abnormalities of gravid uterus, third trimester: Secondary | ICD-10-CM | POA: Diagnosis not present

## 2021-01-15 ENCOUNTER — Encounter (HOSPITAL_COMMUNITY): Payer: Self-pay | Admitting: Obstetrics and Gynecology

## 2021-01-15 ENCOUNTER — Other Ambulatory Visit: Payer: Self-pay | Admitting: Obstetrics and Gynecology

## 2021-01-15 ENCOUNTER — Other Ambulatory Visit: Payer: Self-pay

## 2021-01-15 ENCOUNTER — Inpatient Hospital Stay (HOSPITAL_COMMUNITY)
Admission: AD | Admit: 2021-01-15 | Discharge: 2021-01-15 | Disposition: A | Discharge status: Home / Self Care | Payer: 59 | Attending: Obstetrics and Gynecology | Admitting: Obstetrics and Gynecology

## 2021-01-15 DIAGNOSIS — Z3689 Encounter for other specified antenatal screening: Secondary | ICD-10-CM | POA: Diagnosis not present

## 2021-01-15 DIAGNOSIS — Z3A35 35 weeks gestation of pregnancy: Secondary | ICD-10-CM | POA: Diagnosis not present

## 2021-01-15 DIAGNOSIS — Z3A36 36 weeks gestation of pregnancy: Secondary | ICD-10-CM

## 2021-01-15 DIAGNOSIS — O365930 Maternal care for other known or suspected poor fetal growth, third trimester, not applicable or unspecified: Secondary | ICD-10-CM | POA: Diagnosis not present

## 2021-01-15 DIAGNOSIS — Z79899 Other long term (current) drug therapy: Secondary | ICD-10-CM | POA: Diagnosis not present

## 2021-01-15 DIAGNOSIS — O368330 Maternal care for abnormalities of the fetal heart rate or rhythm, third trimester, not applicable or unspecified: Secondary | ICD-10-CM | POA: Diagnosis not present

## 2021-01-15 DIAGNOSIS — O365990 Maternal care for other known or suspected poor fetal growth, unspecified trimester, not applicable or unspecified: Secondary | ICD-10-CM

## 2021-01-15 DIAGNOSIS — O368390 Maternal care for abnormalities of the fetal heart rate or rhythm, unspecified trimester, not applicable or unspecified: Secondary | ICD-10-CM

## 2021-01-15 NOTE — MAU Provider Note (Signed)
History     [id number]  Arrival date and time: 01/15/21 1013    Chief Complaint  Patient presents with  . Monitoring     HPI Veronica Tran is a 25 y.o. at [redacted]w[redacted]d by PMHx notable for IUGR on prior US, who presents for prolonged fetal monitoring.   Patient of Nestor Ramp Has had IUGR on prenatal [place], though most recent scan by MFM was 28% Micah Flesher today for antenatal testing at [employer] office and had prolonged decel 4-5 minutes Sent to MAU for prolonged monitoring  Reports good fetal movement, no loss of fluid, no vaginal bleeding, no contractions     OB History    Gravida  1   Para  0   Term  0   Preterm  0   AB  0   Living  0     SAB  0   IAB  0   Ectopic  0   Multiple  0   Live Births  0           Past Medical History:  Diagnosis Date  . Anemia    took iron in past - nothing current  . Asthma    no issues in years- no medications  . Chlamydia   . Dyspnea    pt reported-no acute signs of distress  . Headache   . Trichomonas infection   . Vaginal Pap smear, abnormal    f/u scheduled, not HPV    Past Surgical History:  Procedure Laterality Date  . WISDOM TOOTH EXTRACTION      Family History  Problem Relation Age of Onset  . Hypertension Mother   . Cancer Mother        Lymphoma  . Arthritis Mother   . Asthma Father   . Asthma Sister   . Osteoarthritis Maternal Grandmother   . Diabetes Maternal Grandmother   . Hypertension Maternal Grandmother   . Osteoarthritis Paternal Grandmother   . Hypertension Paternal Grandmother   . Hypertension Paternal Grandfather   . Asthma Paternal Grandfather   . Osteoarthritis Paternal Grandfather   . Cancer Paternal Grandfather        Stomach    Social History   Socioeconomic History  . Marital status: Single    Spouse name: Not on file  . Number of children: Not on file  . Years of education: Not on file  . Highest education level: Not on file  Occupational History  . Not on file  Tobacco  Use  . Smoking status: Never Smoker  . Smokeless tobacco: Never Used  Vaping Use  . Vaping Use: Never used  Substance and Sexual Activity  . Alcohol use: Not Currently    Comment: 2-3/months  . Drug use: No  . Sexual activity: Yes    Birth control/protection: None    Comment: 1st intercourse 25 yo-Fewer than 5 partners  Other Topics Concern  . Not on file  Social History Narrative  . Not on file   Social Determinants of Health   Financial Resource Strain: Not on file  Food Insecurity: Not on file  Transportation Needs: Not on file  Physical Activity: Not on file  Stress: Not on file  Social Connections: Not on file  Intimate Partner Violence: Not on file    No Known Allergies  No current facility-administered medications on file prior to encounter.   Current Outpatient Medications on File Prior to Encounter  Medication Sig Dispense Refill  . famotidine (PEPCID) 20 MG tablet Take  1 tablet (20 mg total) by mouth 2 (two) times daily. 60 tablet 0  . glycopyrrolate (ROBINUL) 1 MG tablet Take 1 tablet (1 mg total) by mouth 3 (three) times daily as needed. 30 tablet 0  . ondansetron (ZOFRAN-ODT) 8 MG disintegrating tablet Take 8 mg by mouth every 8 (eight) hours as needed.    . promethazine (PHENERGAN) 25 MG tablet Take 0.5-1 tablets (12.5-25 mg total) by mouth every 6 (six) hours as needed for nausea or vomiting. 30 tablet 0     ROS Pertinent positives and negative per HPI, all others reviewed and negative  Physical Exam   BP 110/71   Pulse (!) 105   Temp 98.7 F (37.1 C) (Oral)   Resp 18   LMP 05/14/2020   SpO2 98% Comment: room air  Patient Vitals for the past 24 hrs:  BP Temp Temp src Pulse Resp SpO2  01/15/21 1035 110/71 - - (!) 105 - -  01/15/21 1029 129/74 98.7 F (37.1 C) Oral 100 18 98 %    Physical Exam Vitals reviewed.  Constitutional:      General: She is not in acute distress.    Appearance: She is well-developed. She is not diaphoretic.  Eyes:      General: No scleral icterus. Pulmonary:     Effort: Pulmonary effort is normal. No respiratory distress.  Abdominal:     General: There is no distension.     Palpations: Abdomen is soft.     Tenderness: There is no abdominal tenderness. There is no guarding or rebound.  Skin:    General: Skin is warm and dry.  Neurological:     Mental Status: She is alert.     Coordination: Coordination normal.      Cervical Exam    Bedside Ultrasound Not done  My interpretation: n/a  FHT Baseline 135, moderate variability, +accels, -decels Toco: none Cat: I  Labs No results found for this or any previous visit (from the past 24 hour(s)).  Imaging No results found.  MAU Course  Procedures Lab Orders  No laboratory test(s) ordered today   No orders of the defined types were placed in this encounter.  Imaging Orders  No imaging studies ordered today    MDM mild  Assessment and Plan  #Prolonged fetal monitoring Prolonged fetal monitoring reassuring. On initial arrival had a decrease from basline to 110's that lasted 4 minutes but stayed within HR. Subsequently has had two hours of very reassuring tracing with numerous accels and no decels.   Discharged to home with return precautions in stable condition.   #FWB FHT Cat I NST: Reactive  [name], MD/MPH 01/15/21 12:44 PM  Allergies as of 01/15/2021   No Known Allergies     Medication List    TAKE these medications   famotidine 20 MG tablet Commonly known as: PEPCID Take 1 tablet (20 mg total) by mouth 2 (two) times daily.   glycopyrrolate 1 MG tablet Commonly known as: Robinul Take 1 tablet (1 mg total) by mouth 3 (three) times daily as needed.   ondansetron 8 MG disintegrating tablet Commonly known as: ZOFRAN-ODT Take 8 mg by mouth every 8 (eight) hours as needed.   promethazine 25 MG tablet Commonly known as: PHENERGAN Take 0.5-1 tablets (12.5-25 mg total) by mouth every 6 (six) hours as  needed for nausea or vomiting.

## 2021-01-15 NOTE — Discharge Instructions (Signed)

## 2021-01-15 NOTE — MAU Note (Signed)
Veronica Tran is a 25 y.o. at [redacted]w[redacted]d here in MAU reporting: was sent over from the office for monitoring due to decel during NST. Pt denies any pain, bleeding, or LOF. +FM  Onset of complaint: today  Pain score: 0/10  Vitals:   01/15/21 1029  BP: 129/74  Pulse: 100  Resp: 18  Temp: 98.7 F (37.1 C)  SpO2: 98%     FHT:EFM applied   Lab orders placed from triage: none

## 2021-01-22 LAB — OB RESULTS CONSOLE GBS: GBS: NEGATIVE

## 2021-01-23 ENCOUNTER — Other Ambulatory Visit: Payer: Self-pay

## 2021-01-23 ENCOUNTER — Encounter: Payer: Self-pay | Admitting: *Deleted

## 2021-01-23 ENCOUNTER — Ambulatory Visit: Payer: 59 | Admitting: *Deleted

## 2021-01-23 ENCOUNTER — Ambulatory Visit: Payer: 59 | Attending: Obstetrics and Gynecology

## 2021-01-23 VITALS — BP 112/68 | HR 88

## 2021-01-23 DIAGNOSIS — Z3A36 36 weeks gestation of pregnancy: Secondary | ICD-10-CM | POA: Diagnosis present

## 2021-01-23 DIAGNOSIS — O3403 Maternal care for unspecified congenital malformation of uterus, third trimester: Secondary | ICD-10-CM

## 2021-01-23 DIAGNOSIS — Q513 Bicornate uterus: Secondary | ICD-10-CM | POA: Diagnosis not present

## 2021-01-23 DIAGNOSIS — O365930 Maternal care for other known or suspected poor fetal growth, third trimester, not applicable or unspecified: Secondary | ICD-10-CM

## 2021-01-23 DIAGNOSIS — O365990 Maternal care for other known or suspected poor fetal growth, unspecified trimester, not applicable or unspecified: Secondary | ICD-10-CM | POA: Diagnosis not present

## 2021-02-12 ENCOUNTER — Telehealth (HOSPITAL_COMMUNITY): Payer: Self-pay | Admitting: *Deleted

## 2021-02-12 NOTE — Telephone Encounter (Signed)
Preadmission screen  

## 2021-02-13 ENCOUNTER — Encounter (HOSPITAL_COMMUNITY): Payer: Self-pay | Admitting: *Deleted

## 2021-02-13 ENCOUNTER — Telehealth (HOSPITAL_COMMUNITY): Payer: Self-pay | Admitting: *Deleted

## 2021-02-13 NOTE — Telephone Encounter (Signed)
Preadmission screen  

## 2021-02-16 ENCOUNTER — Other Ambulatory Visit: Payer: Self-pay | Admitting: Obstetrics and Gynecology

## 2021-02-17 ENCOUNTER — Inpatient Hospital Stay (HOSPITAL_COMMUNITY): Payer: 59 | Admitting: Anesthesiology

## 2021-02-17 ENCOUNTER — Inpatient Hospital Stay (HOSPITAL_COMMUNITY)
Admission: AD | Admit: 2021-02-17 | Discharge: 2021-02-20 | DRG: 787 | Disposition: A | Discharge status: Home / Self Care | Payer: 59 | Attending: Obstetrics and Gynecology | Admitting: Obstetrics and Gynecology

## 2021-02-17 ENCOUNTER — Other Ambulatory Visit: Payer: Self-pay

## 2021-02-17 ENCOUNTER — Encounter (HOSPITAL_COMMUNITY): Payer: Self-pay | Admitting: Obstetrics and Gynecology

## 2021-02-17 ENCOUNTER — Inpatient Hospital Stay (HOSPITAL_COMMUNITY): Payer: 59

## 2021-02-17 DIAGNOSIS — D62 Acute posthemorrhagic anemia: Secondary | ICD-10-CM | POA: Diagnosis not present

## 2021-02-17 DIAGNOSIS — O368130 Decreased fetal movements, third trimester, not applicable or unspecified: Secondary | ICD-10-CM | POA: Diagnosis present

## 2021-02-17 DIAGNOSIS — O9081 Anemia of the puerperium: Secondary | ICD-10-CM | POA: Diagnosis not present

## 2021-02-17 DIAGNOSIS — Z3A39 39 weeks gestation of pregnancy: Secondary | ICD-10-CM | POA: Diagnosis not present

## 2021-02-17 DIAGNOSIS — Z20822 Contact with and (suspected) exposure to covid-19: Secondary | ICD-10-CM | POA: Diagnosis present

## 2021-02-17 DIAGNOSIS — Q513 Bicornate uterus: Secondary | ICD-10-CM | POA: Diagnosis not present

## 2021-02-17 DIAGNOSIS — O3403 Maternal care for unspecified congenital malformation of uterus, third trimester: Secondary | ICD-10-CM | POA: Diagnosis present

## 2021-02-17 DIAGNOSIS — Z3490 Encounter for supervision of normal pregnancy, unspecified, unspecified trimester: Secondary | ICD-10-CM

## 2021-02-17 DIAGNOSIS — Z98891 History of uterine scar from previous surgery: Secondary | ICD-10-CM

## 2021-02-17 LAB — CBC
HCT: 34 % — ABNORMAL LOW (ref 36.0–46.0)
Hemoglobin: 11.1 g/dL — ABNORMAL LOW (ref 12.0–15.0)
MCH: 27.4 pg (ref 26.0–34.0)
MCHC: 32.6 g/dL (ref 30.0–36.0)
MCV: 84 fL (ref 80.0–100.0)
Platelets: 336 10*3/uL (ref 150–400)
RBC: 4.05 MIL/uL (ref 3.87–5.11)
RDW: 14.1 % (ref 11.5–15.5)
WBC: 12.7 10*3/uL — ABNORMAL HIGH (ref 4.0–10.5)
nRBC: 0 % (ref 0.0–0.2)

## 2021-02-17 LAB — TYPE AND SCREEN
ABO/RH(D): O POS
Antibody Screen: NEGATIVE

## 2021-02-17 LAB — RESP PANEL BY RT-PCR (FLU A&B, COVID) ARPGX2
Influenza A by PCR: NEGATIVE
Influenza B by PCR: NEGATIVE
SARS Coronavirus 2 by RT PCR: NEGATIVE

## 2021-02-17 LAB — RPR: RPR Ser Ql: NONREACTIVE

## 2021-02-17 MED ORDER — PHENYLEPHRINE 40 MCG/ML (10ML) SYRINGE FOR IV PUSH (FOR BLOOD PRESSURE SUPPORT): 80.0000 ug | PREFILLED_SYRINGE | INTRAVENOUS | Status: DC | PRN

## 2021-02-17 MED ORDER — ONDANSETRON HCL 4 MG/2ML IJ SOLN: 4.0000 mg | Freq: Four times a day (QID) | INTRAMUSCULAR | Status: DC | PRN

## 2021-02-17 MED ORDER — TERBUTALINE SULFATE 1 MG/ML IJ SOLN: 0.2500 mg | Freq: Once | INTRAMUSCULAR | Status: DC | PRN

## 2021-02-17 MED ORDER — OXYTOCIN-SODIUM CHLORIDE 30-0.9 UT/500ML-% IV SOLN
1.0000 m[IU]/min | INTRAVENOUS | Status: DC
  Administered 2021-02-17: 1 m[IU]/min via INTRAVENOUS
  Filled 2021-02-17: qty 500

## 2021-02-17 MED ORDER — LACTATED RINGERS IV SOLN
500.0000 mL | INTRAVENOUS | Status: DC | PRN
  Administered 2021-02-17: 1000 mL via INTRAVENOUS

## 2021-02-17 MED ORDER — EPHEDRINE 5 MG/ML INJ: 10.0000 mg | INTRAVENOUS | Status: DC | PRN

## 2021-02-17 MED ORDER — LIDOCAINE HCL (PF) 1 % IJ SOLN
30.0000 mL | INTRAMUSCULAR | Status: DC | PRN
Start: 2021-02-17 — End: 2021-02-18

## 2021-02-17 MED ORDER — OXYTOCIN-SODIUM CHLORIDE 30-0.9 UT/500ML-% IV SOLN: 2.5000 [IU]/h | INTRAVENOUS | Status: DC

## 2021-02-17 MED ORDER — OXYTOCIN BOLUS FROM INFUSION
333.0000 mL | Freq: Once | INTRAVENOUS | Status: DC
Start: 2021-02-17 — End: 2021-02-18

## 2021-02-17 MED ORDER — OXYCODONE-ACETAMINOPHEN 5-325 MG PO TABS: 2.0000 | ORAL_TABLET | ORAL | Status: DC | PRN

## 2021-02-17 MED ORDER — LACTATED RINGERS IV SOLN: 500.0000 mL | Freq: Once | INTRAVENOUS | Status: DC

## 2021-02-17 MED ORDER — LIDOCAINE HCL (PF) 1 % IJ SOLN
INTRAMUSCULAR | Status: DC | PRN
  Administered 2021-02-17 (×2): 4 mL via EPIDURAL

## 2021-02-17 MED ORDER — ACETAMINOPHEN 325 MG PO TABS: 650.0000 mg | ORAL_TABLET | ORAL | Status: DC | PRN

## 2021-02-17 MED ORDER — MISOPROSTOL 25 MCG QUARTER TABLET
25.0000 ug | ORAL_TABLET | ORAL | Status: DC | PRN
  Administered 2021-02-17 (×3): 25 ug via VAGINAL
  Filled 2021-02-17 (×3): qty 1

## 2021-02-17 MED ORDER — FENTANYL-BUPIVACAINE-NACL 0.5-0.125-0.9 MG/250ML-% EP SOLN
12.0000 mL/h | EPIDURAL | Status: DC | PRN
  Administered 2021-02-17: 12 mL/h via EPIDURAL
  Filled 2021-02-17 (×2): qty 250

## 2021-02-17 MED ORDER — SOD CITRATE-CITRIC ACID 500-334 MG/5ML PO SOLN
30.0000 mL | ORAL | Status: DC | PRN
  Administered 2021-02-18: 30 mL via ORAL
  Filled 2021-02-17: qty 15

## 2021-02-17 MED ORDER — DIPHENHYDRAMINE HCL 50 MG/ML IJ SOLN: 12.5000 mg | INTRAMUSCULAR | Status: DC | PRN

## 2021-02-17 MED ORDER — FENTANYL CITRATE (PF) 100 MCG/2ML IJ SOLN: 50.0000 ug | INTRAMUSCULAR | Status: DC | PRN

## 2021-02-17 MED ORDER — OXYCODONE-ACETAMINOPHEN 5-325 MG PO TABS
1.0000 | ORAL_TABLET | ORAL | Status: DC | PRN
Start: 2021-02-17 — End: 2021-02-18

## 2021-02-17 MED ORDER — LACTATED RINGERS IV SOLN: INTRAVENOUS | Status: DC

## 2021-02-17 NOTE — Progress Notes (Signed)
Patient ID: Erick Oxendine, female   DOB: April 13, 1996, 25 y.o.   MRN: [id number]  S: Patient starting to feel uncomfortable with contractions O:  Vitals:   02/17/21 0722 02/17/21 0900 02/17/21 1000 02/17/21 1117  BP: 117/67 123/74 116/75 104/73  Pulse: 97 94 90 96  Resp: 17     Temp: 98.2 F (36.8 C)     TempSrc: Oral     Weight:      Height:       Fetal heart rate 140s, positive accelerations, reactive, category 1 tracing Cervix 1/70/-2 posterior Toco irregular 2 to 3 minutes  Patient status post 3 Cytotec's.  Recommended Foley catheter placement for continued augmentation of labor.  Patient is anxious about a Foley catheter due to an issue her friend had with her labor.  Her friend also had a Foley catheter placed to induce labor.  Risks/benefits/alternatives of Foley catheter placement for induction of labor discussed at length.  Patient wishes to have an epidural placed prior to Foley catheter placement  Fetal wellbeing reassuring

## 2021-02-17 NOTE — Anesthesia Procedure Notes (Signed)
Epidural Patient location during procedure: OB Start time: 02/17/2021 3:48 PM End time: 02/17/2021 3:58 PM  Staffing Anesthesiologist: Mal Amabile, MD Performed: anesthesiologist   Preanesthetic Checklist Completed: patient identified, IV checked, site marked, risks and benefits discussed, surgical consent, monitors and equipment checked, pre-op evaluation and timeout performed  Epidural Patient position: sitting Prep: DuraPrep and site prepped and draped Patient monitoring: continuous pulse ox and blood pressure Approach: midline Location: L3-L4 Injection technique: LOR air  Needle:  Needle type: Tuohy  Needle gauge: 17 G Needle length: 9 cm and 9 Needle insertion depth: 6 cm Catheter type: closed end flexible Catheter size: 19 Gauge Catheter at skin depth: 11 cm Test dose: negative and Other  Assessment Events: blood not aspirated, injection not painful, no injection resistance, no paresthesia and negative IV test  Additional Notes Patient identified. Risks and benefits discussed including failed block, incomplete  Pain control, post dural puncture headache, nerve damage, paralysis, blood pressure Changes, nausea, vomiting, reactions to medications-both toxic and allergic and post Partum back pain. All questions were answered. Patient expressed understanding and wished to proceed. Sterile technique was used throughout procedure. Epidural site was Dressed with sterile barrier dressing. No paresthesias, signs of intravascular injection Or signs of intrathecal spread were encountered.  Patient was more comfortable after the epidural was dosed. Please see RN's note for documentation of vital signs and FHR which are stable. Reason for block:procedure for pain

## 2021-02-17 NOTE — H&P (Signed)
Veronica Tran is a 25 y.o. female presenting for term induction of labor  25 year old G1 P0 at 39+6 weeks presents to labor and delivery for term induction of labor.  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  At 33 weeks there was concern for fetal growth restriction due to an [facility] less than 3rd percentile.  Follow-up ultrasound at maternal-fetal medicine did not demonstrate growth restriction.  Again another follow-up ultrasound at 36 weeks also did not show growth restriction.  She has been followed in the office subsequently with nonstress tests as a precaution.  Yesterday in the office she reported decreased fetal movement and the decision was made to proceed with induction. OB History    Gravida  1   Para  0   Term  0   Preterm  0   AB  0   Living  0     SAB  0   IAB  0   Ectopic  0   Multiple  0   Live Births  0          Past Medical History:  Diagnosis Date  . Anemia    took iron in past - nothing current  . Asthma    no issues in years- no medications  . Chlamydia   . Dyspnea    pt reported-no acute signs of distress  . Headache   . Trichomonas infection   . Vaginal Pap smear, abnormal    f/u scheduled, not HPV   Past Surgical History:  Procedure Laterality Date  . WISDOM TOOTH EXTRACTION     Family History: family history includes Arthritis in her mother; Asthma in her father, paternal grandfather, and sister; Cancer in her mother and paternal grandfather; Diabetes in her maternal grandmother; Hypertension in her maternal grandmother, mother, paternal grandfather, and paternal grandmother; Osteoarthritis in her maternal grandmother, paternal grandfather, and paternal grandmother. Social History:  reports that she has never smoked. She has never used smokeless tobacco. She reports previous alcohol use. She reports that she does not use drugs.     Maternal Diabetes: No Genetic Screening:  Normal Maternal Ultrasounds/Referrals: Normal Fetal Ultrasounds or other Referrals:  None Maternal Substance Abuse:  No Significant Maternal Medications:  None Significant Maternal Lab Results:  None Other Comments:  None  Review of Systems History Dilation: 1 Effacement (%): 20 Station: -2 Exam by:: Mellody Dance, RN Blood pressure 123/74, pulse 94, temperature 98.2 F (36.8 C), temperature source Oral, resp. rate 17, height 5\' 6"  (1.676 m), weight 73.1 kg, last menstrual period 05/14/2020. Exam Physical Exam  Prenatal labs: ABO, Rh: --/--/O POS (04/19 0030) Antibody: NEG (04/19 0030) Rubella: Immune (09/17 0000) RPR: Nonreactive (09/17 0000)  HBsAg: Negative (09/17 0000)  HIV: Non-reactive (09/17 0000)  GBS:   neg  Assessment/Plan: Admit Cytotec 25 mcg every 4 hours per vagina then plan either Foley bulb or AROM Pit depending on cervical dilation Epidural on request    12-17-1983 02/17/2021, 10:12 AM

## 2021-02-17 NOTE — Anesthesia Preprocedure Evaluation (Addendum)
Anesthesia Evaluation  Patient identified by MRN, date of birth, ID band Patient awake    Reviewed: Allergy & Precautions, Patient's Chart, lab work & pertinent test results  Airway Mallampati: II  TM Distance: >3 FB Neck ROM: Full    Dental no notable dental hx. (+) Teeth Intact   Pulmonary shortness of breath and with exertion, asthma ,    Pulmonary exam normal breath sounds clear to auscultation       Cardiovascular negative cardio ROS Normal cardiovascular exam Rhythm:Regular Rate:Normal     Neuro/Psych  Headaches, negative psych ROS   GI/Hepatic Neg liver ROS, GERD  Medicated and Controlled,  Endo/Other  negative endocrine ROS  Renal/GU negative Renal ROS  negative genitourinary   Musculoskeletal negative musculoskeletal ROS (+)   Abdominal   Peds  Hematology  (+) anemia ,   Anesthesia Other Findings   Reproductive/Obstetrics (+) Pregnancy Hx/o bicornuate uterus                             Anesthesia Physical Anesthesia Plan  ASA: II and emergent  Anesthesia Plan: Epidural   Post-op Pain Management:    Induction:   PONV Risk Score and Plan:   Airway Management Planned: Natural Airway  Additional Equipment:   Intra-op Plan:   Post-operative Plan:   Informed Consent: I have reviewed the patients History and Physical, chart, labs and discussed the procedure including the risks, benefits and alternatives for the proposed anesthesia with the patient or authorized representative who has indicated his/her understanding and acceptance.       Plan Discussed with: Anesthesiologist  Anesthesia Plan Comments: (Pt to OR for C/S due to fetal intolerance to labor. Labor epidural in place and functioning well. Plan for C/S under epidural.)       Anesthesia Quick Evaluation

## 2021-02-17 NOTE — Progress Notes (Signed)
Patient ID: Veronica Tran, female   DOB: 05/18/1996, 25 y.o.   MRN: [id number]  S: Comfortable after epidural O:  Today's Vitals   02/17/21 1615 02/17/21 1620 02/17/21 1625 02/17/21 1630  BP: 120/81 119/85 122/81 121/64  Pulse: 88 87 87 84  Resp:      Temp:   97.6 F (36.4 C)   TempSrc:      Weight:      Height:      PainSc:       Body mass index is 26 kg/m.;  140 reactive, cat 1 tracing cvx 1/80/-2 toco Q2   Foley placed through the cervix without difficulty, balloon inflated to 60cc Consider starting pitocin if contraction patter spreads out FWB reassuring

## 2021-02-18 ENCOUNTER — Encounter (HOSPITAL_COMMUNITY)
Admission: AD | Disposition: A | Discharge status: Home / Self Care | Payer: Self-pay | Source: Home / Self Care | Attending: Obstetrics and Gynecology

## 2021-02-18 ENCOUNTER — Encounter (HOSPITAL_COMMUNITY): Payer: Self-pay | Admitting: Obstetrics and Gynecology

## 2021-02-18 SURGERY — Surgical Case
Anesthesia: Epidural

## 2021-02-18 MED ORDER — FENTANYL CITRATE (PF) 100 MCG/2ML IJ SOLN
INTRAMUSCULAR | Status: DC | PRN
  Administered 2021-02-18: 100 ug via EPIDURAL

## 2021-02-18 MED ORDER — SCOPOLAMINE 1 MG/3DAYS TD PT72: 1.0000 | MEDICATED_PATCH | Freq: Once | TRANSDERMAL | Status: DC

## 2021-02-18 MED ORDER — OXYTOCIN-SODIUM CHLORIDE 30-0.9 UT/500ML-% IV SOLN
INTRAVENOUS | Status: AC
  Filled 2021-02-18: qty 500

## 2021-02-18 MED ORDER — ONDANSETRON HCL 4 MG/2ML IJ SOLN
INTRAMUSCULAR | Status: DC | PRN
  Administered 2021-02-18: 4 mg via INTRAVENOUS

## 2021-02-18 MED ORDER — KETOROLAC TROMETHAMINE 30 MG/ML IJ SOLN
30.0000 mg | Freq: Four times a day (QID) | INTRAMUSCULAR | Status: AC
  Administered 2021-02-19 (×3): 30 mg via INTRAVENOUS
  Filled 2021-02-18 (×3): qty 1

## 2021-02-18 MED ORDER — HYDROMORPHONE HCL 1 MG/ML IJ SOLN: 0.2000 mg | INTRAMUSCULAR | Status: DC | PRN

## 2021-02-18 MED ORDER — KETOROLAC TROMETHAMINE 30 MG/ML IJ SOLN: 30.0000 mg | Freq: Four times a day (QID) | INTRAMUSCULAR | Status: AC | PRN

## 2021-02-18 MED ORDER — CEFAZOLIN SODIUM-DEXTROSE 2-4 GM/100ML-% IV SOLN
INTRAVENOUS | Status: AC
  Filled 2021-02-18: qty 100

## 2021-02-18 MED ORDER — OXYTOCIN-SODIUM CHLORIDE 30-0.9 UT/500ML-% IV SOLN: 1.0000 m[IU]/min | INTRAVENOUS | Status: DC

## 2021-02-18 MED ORDER — ACETAMINOPHEN 500 MG PO TABS
1000.0000 mg | ORAL_TABLET | Freq: Four times a day (QID) | ORAL | Status: DC
  Administered 2021-02-19 – 2021-02-20 (×6): 1000 mg via ORAL
  Filled 2021-02-18 (×6): qty 2

## 2021-02-18 MED ORDER — WITCH HAZEL-GLYCERIN EX PADS: 1.0000 "application " | MEDICATED_PAD | CUTANEOUS | Status: DC | PRN

## 2021-02-18 MED ORDER — NALBUPHINE HCL 10 MG/ML IJ SOLN: 5.0000 mg | Freq: Once | INTRAMUSCULAR | Status: DC | PRN

## 2021-02-18 MED ORDER — LACTATED RINGERS IV SOLN: INTRAVENOUS | Status: DC

## 2021-02-18 MED ORDER — LIDOCAINE-EPINEPHRINE 2 %-1:100000 IJ SOLN
INTRAMUSCULAR | Status: DC | PRN
  Administered 2021-02-18 (×2): 5 mL via INTRADERMAL
  Administered 2021-02-18: 3 mL via INTRADERMAL

## 2021-02-18 MED ORDER — DIBUCAINE (PERIANAL) 1 % EX OINT: 1.0000 "application " | TOPICAL_OINTMENT | CUTANEOUS | Status: DC | PRN

## 2021-02-18 MED ORDER — SENNOSIDES-DOCUSATE SODIUM 8.6-50 MG PO TABS
2.0000 | ORAL_TABLET | ORAL | Status: DC
  Administered 2021-02-19 – 2021-02-20 (×2): 2 via ORAL
  Filled 2021-02-18 (×3): qty 2

## 2021-02-18 MED ORDER — SIMETHICONE 80 MG PO CHEW
80.0000 mg | CHEWABLE_TABLET | Freq: Three times a day (TID) | ORAL | Status: DC
  Administered 2021-02-19 – 2021-02-20 (×4): 80 mg via ORAL
  Filled 2021-02-18 (×5): qty 1

## 2021-02-18 MED ORDER — SIMETHICONE 80 MG PO CHEW
80.0000 mg | CHEWABLE_TABLET | ORAL | Status: DC | PRN
Start: 2021-02-18 — End: 2021-02-20
  Filled 2021-02-18: qty 1

## 2021-02-18 MED ORDER — NALBUPHINE HCL 10 MG/ML IJ SOLN
5.0000 mg | INTRAMUSCULAR | Status: DC | PRN
  Administered 2021-02-18 – 2021-02-19 (×2): 5 mg via INTRAVENOUS
  Filled 2021-02-18: qty 1

## 2021-02-18 MED ORDER — PRENATAL MULTIVITAMIN CH
1.0000 | ORAL_TABLET | Freq: Every day | ORAL | Status: DC
  Administered 2021-02-19 – 2021-02-20 (×2): 1 via ORAL
  Filled 2021-02-18 (×2): qty 1

## 2021-02-18 MED ORDER — SODIUM CHLORIDE 0.9 % IV SOLN
INTRAVENOUS | Status: AC
  Filled 2021-02-18: qty 500

## 2021-02-18 MED ORDER — MORPHINE SULFATE (PF) 0.5 MG/ML IJ SOLN
INTRAMUSCULAR | Status: DC | PRN
  Administered 2021-02-18: 3 mg via EPIDURAL

## 2021-02-18 MED ORDER — PHENYLEPHRINE HCL (PRESSORS) 10 MG/ML IV SOLN
INTRAVENOUS | Status: DC | PRN
  Administered 2021-02-18 (×3): 120 ug via INTRAVENOUS
  Administered 2021-02-18 (×4): 80 ug via INTRAVENOUS

## 2021-02-18 MED ORDER — NALOXONE HCL 4 MG/10ML IJ SOLN
1.0000 ug/kg/h | INTRAVENOUS | Status: DC | PRN
  Filled 2021-02-18: qty 5

## 2021-02-18 MED ORDER — DEXAMETHASONE SODIUM PHOSPHATE 4 MG/ML IJ SOLN
INTRAMUSCULAR | Status: AC
  Filled 2021-02-18: qty 1

## 2021-02-18 MED ORDER — NALBUPHINE HCL 10 MG/ML IJ SOLN
5.0000 mg | Freq: Once | INTRAMUSCULAR | Status: DC | PRN
  Filled 2021-02-18: qty 1

## 2021-02-18 MED ORDER — SODIUM CHLORIDE 0.9% FLUSH: 3.0000 mL | INTRAVENOUS | Status: DC | PRN

## 2021-02-18 MED ORDER — DIPHENHYDRAMINE HCL 25 MG PO CAPS
25.0000 mg | ORAL_CAPSULE | Freq: Four times a day (QID) | ORAL | Status: DC | PRN
Start: 2021-02-18 — End: 2021-02-20

## 2021-02-18 MED ORDER — FENTANYL CITRATE (PF) 100 MCG/2ML IJ SOLN
INTRAMUSCULAR | Status: AC
  Filled 2021-02-18: qty 2

## 2021-02-18 MED ORDER — PHENYLEPHRINE 40 MCG/ML (10ML) SYRINGE FOR IV PUSH (FOR BLOOD PRESSURE SUPPORT)
PREFILLED_SYRINGE | INTRAVENOUS | Status: AC
  Filled 2021-02-18: qty 20

## 2021-02-18 MED ORDER — NALOXONE HCL 0.4 MG/ML IJ SOLN: 0.4000 mg | INTRAMUSCULAR | Status: DC | PRN

## 2021-02-18 MED ORDER — SODIUM CHLORIDE 0.9 % IV SOLN
500.0000 mg | INTRAVENOUS | Status: AC
  Administered 2021-02-18: 500 mg via INTRAVENOUS

## 2021-02-18 MED ORDER — SOD CITRATE-CITRIC ACID 500-334 MG/5ML PO SOLN: 30.0000 mL | ORAL | Status: DC

## 2021-02-18 MED ORDER — OXYCODONE HCL 5 MG PO TABS
5.0000 mg | ORAL_TABLET | ORAL | Status: DC | PRN
  Administered 2021-02-19: 5 mg via ORAL
  Filled 2021-02-18: qty 1

## 2021-02-18 MED ORDER — NALBUPHINE HCL 10 MG/ML IJ SOLN: 5.0000 mg | INTRAMUSCULAR | Status: DC | PRN

## 2021-02-18 MED ORDER — MORPHINE SULFATE (PF) 0.5 MG/ML IJ SOLN
INTRAMUSCULAR | Status: AC
  Filled 2021-02-18: qty 10

## 2021-02-18 MED ORDER — SODIUM CHLORIDE 0.9 % IV SOLN: INTRAVENOUS | Status: DC | PRN

## 2021-02-18 MED ORDER — ONDANSETRON HCL 4 MG/2ML IJ SOLN: 4.0000 mg | Freq: Three times a day (TID) | INTRAMUSCULAR | Status: DC | PRN

## 2021-02-18 MED ORDER — OXYTOCIN-SODIUM CHLORIDE 30-0.9 UT/500ML-% IV SOLN: 2.5000 [IU]/h | INTRAVENOUS | Status: AC

## 2021-02-18 MED ORDER — IBUPROFEN 600 MG PO TABS
600.0000 mg | ORAL_TABLET | Freq: Four times a day (QID) | ORAL | Status: DC
  Administered 2021-02-19 – 2021-02-20 (×4): 600 mg via ORAL
  Filled 2021-02-18 (×4): qty 1

## 2021-02-18 MED ORDER — BISACODYL 10 MG RE SUPP: 10.0000 mg | Freq: Every day | RECTAL | Status: DC | PRN

## 2021-02-18 MED ORDER — ONDANSETRON HCL 4 MG/2ML IJ SOLN
INTRAMUSCULAR | Status: AC
  Filled 2021-02-18: qty 2

## 2021-02-18 MED ORDER — MEPERIDINE HCL 25 MG/ML IJ SOLN: 6.2500 mg | INTRAMUSCULAR | Status: DC | PRN

## 2021-02-18 MED ORDER — DIPHENHYDRAMINE HCL 25 MG PO CAPS: 25.0000 mg | ORAL_CAPSULE | ORAL | Status: DC | PRN

## 2021-02-18 MED ORDER — OXYTOCIN-SODIUM CHLORIDE 30-0.9 UT/500ML-% IV SOLN
INTRAVENOUS | Status: DC | PRN
  Administered 2021-02-18: 30 [IU] via INTRAVENOUS

## 2021-02-18 MED ORDER — DEXAMETHASONE SODIUM PHOSPHATE 4 MG/ML IJ SOLN
INTRAMUSCULAR | Status: DC | PRN
  Administered 2021-02-18: 4 mg via INTRAVENOUS

## 2021-02-18 MED ORDER — CEFAZOLIN SODIUM-DEXTROSE 2-4 GM/100ML-% IV SOLN
2.0000 g | INTRAVENOUS | Status: AC
  Administered 2021-02-18: 2 g via INTRAVENOUS

## 2021-02-18 MED ORDER — FLEET ENEMA 7-19 GM/118ML RE ENEM: 1.0000 | ENEMA | Freq: Every day | RECTAL | Status: DC | PRN

## 2021-02-18 MED ORDER — MENTHOL 3 MG MT LOZG: 1.0000 | LOZENGE | OROMUCOSAL | Status: DC | PRN

## 2021-02-18 MED ORDER — COCONUT OIL OIL: 1.0000 "application " | TOPICAL_OIL | Status: DC | PRN

## 2021-02-18 MED ORDER — STERILE WATER FOR IRRIGATION IR SOLN
Status: DC | PRN
  Administered 2021-02-18: 1

## 2021-02-18 MED ORDER — DIPHENHYDRAMINE HCL 50 MG/ML IJ SOLN: 12.5000 mg | INTRAMUSCULAR | Status: DC | PRN

## 2021-02-18 SURGICAL SUPPLY — 30 items
BENZOIN TINCTURE PRP APPL 2/3 (GAUZE/BANDAGES/DRESSINGS) ×2 IMPLANT
CLAMP CORD UMBIL (MISCELLANEOUS) IMPLANT
CLOTH BEACON ORANGE TIMEOUT ST (SAFETY) ×2 IMPLANT
DRSG OPSITE POSTOP 4X10 (GAUZE/BANDAGES/DRESSINGS) ×2 IMPLANT
ELECT REM PT RETURN 9FT ADLT (ELECTROSURGICAL) ×2
ELECTRODE REM PT RTRN 9FT ADLT (ELECTROSURGICAL) ×1 IMPLANT
EXTRACTOR VACUUM BELL STYLE (SUCTIONS) IMPLANT
GLOVE BIOGEL PI IND STRL 6.5 (GLOVE) ×1 IMPLANT
GLOVE BIOGEL PI IND STRL 7.0 (GLOVE) ×2 IMPLANT
GLOVE BIOGEL PI INDICATOR 6.5 (GLOVE) ×1
GLOVE BIOGEL PI INDICATOR 7.0 (GLOVE) ×2
GLOVE ECLIPSE 6.0 STRL STRAW (GLOVE) ×2 IMPLANT
GOWN STRL REUS W/TWL LRG LVL3 (GOWN DISPOSABLE) ×4 IMPLANT
KIT ABG SYR 3ML LUER SLIP (SYRINGE) IMPLANT
NEEDLE HYPO 25X5/8 SAFETYGLIDE (NEEDLE) IMPLANT
NS IRRIG 1000ML POUR BTL (IV SOLUTION) ×2 IMPLANT
PACK C SECTION WH (CUSTOM PROCEDURE TRAY) ×2 IMPLANT
PAD OB MATERNITY 4.3X12.25 (PERSONAL CARE ITEMS) ×2 IMPLANT
PENCIL SMOKE EVAC W/HOLSTER (ELECTROSURGICAL) ×2 IMPLANT
RTRCTR C-SECT PINK 25CM LRG (MISCELLANEOUS) ×2 IMPLANT
STRIP CLOSURE SKIN 1/2X4 (GAUZE/BANDAGES/DRESSINGS) ×2 IMPLANT
SUT MNCRL 0 VIOLET CTX 36 (SUTURE) ×2 IMPLANT
SUT MONOCRYL 0 CTX 36 (SUTURE) ×2
SUT VIC AB 0 CT1 36 (SUTURE) ×4 IMPLANT
SUT VIC AB 3-0 CT1 27 (SUTURE)
SUT VIC AB 3-0 CT1 TAPERPNT 27 (SUTURE) IMPLANT
SUT VIC AB 4-0 KS 27 (SUTURE) ×2 IMPLANT
TOWEL OR 17X24 6PK STRL BLUE (TOWEL DISPOSABLE) ×2 IMPLANT
TRAY FOLEY W/BAG SLVR 14FR LF (SET/KITS/TRAYS/PACK) ×2 IMPLANT
WATER STERILE IRR 1000ML POUR (IV SOLUTION) ×2 IMPLANT

## 2021-02-18 NOTE — Transfer of Care (Signed)
Immediate Anesthesia Transfer of Care Note  Patient: Veronica Tran  Procedure(s) Performed: CESAREAN SECTION  Patient Location: PACU  Anesthesia Type:Epidural  Level of Consciousness: awake, alert  and oriented  Airway & Oxygen Therapy: Patient Spontanous Breathing  Post-op Assessment: Report given to RN and Post -op Vital signs reviewed and stable  Post vital signs: Reviewed and stable  Last Vitals:  Vitals Value Taken Time  BP 97/60 02/18/21 1748  Temp    Pulse 100 02/18/21 1751  Resp 17 02/18/21 1751  SpO2 97 % 02/18/21 1751  Vitals shown include unvalidated device data.  Last Pain:  Vitals:   02/18/21 1508  TempSrc: Oral  PainSc:          Complications: No complications documented.

## 2021-02-18 NOTE — Progress Notes (Signed)
OB Progress Note  Into room for recurrent late decelerations not responding to position change.   O: Today's Vitals   02/18/21 1030 02/18/21 1100 02/18/21 1130 02/18/21 1200  BP: 116/78 125/77 111/79 122/64  Pulse: (!) 120 (!) 106 (!) 102 (!) 113  Resp: 16 16 16  (S) 16  Temp:      TempSrc:      Weight:      Height:      PainSc:       Body mass index is 26 kg/m.  SVE 6/80/-1 (unchanged)  FHR: 145bpm, minimal to moderate variability, no accels, recurrent late decels Toco:ctx q 2-3 mins (IUPC in place)   A/P: Pitocin turned off, fluids bolused, patient in throne position - late decelerations are currently resolved. Plan to restart pitocin at 57mu/min at 1300 if cat I tracing. Discussed with patient - signs of fetal distress with contractions, will give short break. Discussed contractions are necessary for cervical change, if baby unable to tolerate contractions, would recommend a primary cesarean. Patient, her mother, and partner state understanding of plan.   3m, MD 02/18/21 12:42 PM

## 2021-02-18 NOTE — Progress Notes (Signed)
OB Progress Note  S: Patient comfortable  Foley balloon fell out around 715, exam at that time was 5/80/-2   O: Today's Vitals   02/18/21 0800 02/18/21 0830 02/18/21 0900 02/18/21 0930  BP: 126/87 117/79 132/82 109/70  Pulse: 92 100 (!) 131 94  Resp: 16 16 16 16   Temp:      TempSrc:      Weight:      Height:      PainSc:       Body mass index is 26 kg/m.  SVE 6/80/-1, no membranes palpated, clear fluid noted on chucks  FHR: 130bpm, moderate variability, + accels, early decels Toco: ctx q 2-3 mins   A/P: 25Y G1P0 @ [redacted]w[redacted]d, IOL 1. IOL: s/p foley balloon and cytotec, s/p SROM around 10am, continue pitocin currently at 75mu/min 2. Pain control: epidural 3. Fetal monitoring: cat I with early decelerations   M. 9m, MD 02/18/21 10:13 AM

## 2021-02-18 NOTE — Anesthesia Postprocedure Evaluation (Signed)
Anesthesia Post Note  Patient: Veronica Tran  Procedure(s) Performed: CESAREAN SECTION     Patient location during evaluation: PACU Anesthesia Type: Epidural Level of consciousness: oriented and awake and alert Pain management: pain level controlled Vital Signs Assessment: post-procedure vital signs reviewed and stable Respiratory status: spontaneous breathing, respiratory function stable and patient connected to nasal cannula oxygen Cardiovascular status: blood pressure returned to baseline and stable Postop Assessment: no headache, no backache, no apparent nausea or vomiting and epidural receding Anesthetic complications: no   No complications documented.  Last Vitals:  Vitals:   02/18/21 1900 02/18/21 1925  BP: (!) 111/53 113/85  Pulse: (!) 101 95  Resp: (!) 25 (!) 22  Temp:  36.9 C  SpO2: 97% 99%    Last Pain:  Vitals:   02/18/21 1925  TempSrc: Oral  PainSc: 0-No pain   Pain Goal:                Epidural/Spinal Function Cutaneous sensation: Pins and Needles (02/18/21 1900), Patient able to flex knees: Yes (02/18/21 1900), Patient able to lift hips off bed: Yes (02/18/21 1900), Back pain beyond tenderness at insertion site: No (02/18/21 1900), Progressively worsening motor and/or sensory loss: No (02/18/21 1900), Bowel and/or bladder incontinence post epidural: No (02/18/21 1900)  Braxton Weisbecker L Shayle Donahoo

## 2021-02-18 NOTE — Op Note (Signed)
CESAREAN SECTION Procedure Note  Patient: Veronica Tran is a 25 y.o. G1P0000 @ [redacted]w[redacted]d  Preoperative Diagnosis: Nonreassuring fetal heart tracing, arrest of dilation Postoperative Diagnosis: same, delivered  Procedure: Primary cesarean      Surgeon: Charlett Nose , MD  Anesthesia: Spinal anesthesia   Findings: Bicornuate uterus, normal appearing fallopian tubes bilaterally and ovaries bilaterally.  Viable female infant in vertex, left occiput transverse presentation delivered at 1658  with weight 2690g (5pounds 14.9ounces) Apgars 3, 5, and 9.  Estimated Blood Loss:          Specimens: Placenta to L&D for disposal         Complications:  None         Disposition: PACU - hemodynamically stable.         Condition: stable    Description of Procedure: The patient was appropriately consented and taken to the operating room where epidural anesthesia was rebolused and found to be adequate.  The patient was placed in the dorsal supine position.  Fetal heart tones were confirmed. Thromboguards were applied and cycling. A foley catheter was already in place and draining. Ancef 2g and Azithromycin 500mg  were given for infection prophylaxis. The patient was subsequently prepped and draped in the normal sterile fashion.    A low transverse skin incision was made with a scalpel and carried down to the level of the fascia with the Bovie.  The fascia was incised in the midline with the scalpel and extended laterally with curved Mayo scissors.  Kocher clamps were applied to the inferior fascial edge and the fascia was dissected off the rectus muscle sharply using the Mayo scissors.  The Kocher clamps were transferred to the superior fascial edge and the underlying rectus muscle was dissected off with curved Mayo's scissors.  The rectus muscles then were separated in the midline.  The peritoneum was found free of adherent bowel and the peritoneal cavity was entered with Metzenbaum scissors.  The  uterus was identified and the alexis retractor was placed intraperitoneal.  A bladder flap was then created sharply with Metzenbaum scissors and separated from the lower uterine segment digitally.   A low transverse hysterotomy was then made with a scalpel.  The infant was found in the vertex presentation was delivered atraumatically and without difficulty with standard maneuvers.  After 60 seconds of delayed cord clamping the cord was clamped and cut and the infant was handed off to the pediatricians.  The placenta was delivered with gentle traction on umbilical cord and manual massage of the uterine fundus.  The uterus was cleared of all clot and debris.  The hysterotomy was then closed with 0 monocryl in a running locked fashion,  followed by 0 Monocryl in an imbricating fashion.  The hysterotomy was found to be hemostatic. The fascia was closed with a 0 Vicryl suture in a continuous running fashion.  The subcutaneous tissue was irrigated and rendered hemostatic with cautery.  The subcutaneous layer was subsequently closed with 3-0 Vicryl in a continuous running fashion.  The skin was closed with 4-0 vicryl  in a running subcuticular fashion.  Sponge, lap and needle counts were correct. Steri strips and a Honeycomb dressing were placed on the incision and a pressure dressing was applied  02/18/21 5:49 PM

## 2021-02-18 NOTE — Progress Notes (Signed)
Rechecked SVE at 1600, cervix unchanged 6/80/-1 with swelling. Discussed 6 hours without change (though took 45 min pitocin break) despite ROM, pitocin, adequate contractions. At time of conversation, FHR cat I with early decelerations only. Discussed reasonable to recheck in 1 hour and if no change proceed with primary cesarean. However immediately following conversation, began to have recurrent late decelerations, repeat exam unchanged, and I recommended that we proceed with cesarean for NRFHT in setting of arrest of dilation. Pitocin turned off.  Reviewed risks of cesarean including infection, bleeding, damage to surrounding organs. Patient, her partner, and mother all stated understanding and all questions were answered.   L&D OR notified Ancef2g, Azithro 500mg   Foley Epidural in place  M. M, MD 02/18/21 4:31 PM

## 2021-02-19 ENCOUNTER — Encounter (HOSPITAL_COMMUNITY): Payer: Self-pay | Admitting: Obstetrics and Gynecology

## 2021-02-19 LAB — CBC
HCT: 28.9 % — ABNORMAL LOW (ref 36.0–46.0)
Hemoglobin: 9.4 g/dL — ABNORMAL LOW (ref 12.0–15.0)
MCH: 27.6 pg (ref 26.0–34.0)
MCHC: 32.5 g/dL (ref 30.0–36.0)
MCV: 85 fL (ref 80.0–100.0)
Platelets: 269 10*3/uL (ref 150–400)
RBC: 3.4 MIL/uL — ABNORMAL LOW (ref 3.87–5.11)
RDW: 14.2 % (ref 11.5–15.5)
WBC: 27.2 10*3/uL — ABNORMAL HIGH (ref 4.0–10.5)
nRBC: 0 % (ref 0.0–0.2)

## 2021-02-19 NOTE — Progress Notes (Signed)
Subjective: Postpartum Day 1: Cesarean Delivery Patient reports pain controlled, no nausea or vomiting, tolerating p.o. well, voiding without difficulty.  Bottlefeeding.  Baby currently feeding poorly.  Objective: Vital signs in last 24 hours: Temp:  [97.8 F (36.6 C)-100.2 F (37.9 C)] 98.4 F (36.9 C) (04/21 0812) Pulse Rate:  [73-124] 73 (04/21 0700) Resp:  [15-25] 16 (04/21 0700) BP: (97-134)/(53-93) 111/75 (04/21 0700) SpO2:  [94 %-99 %] 97 % (04/21 7017)  Physical Exam:  General: alert, cooperative and appears stated age Lochia: appropriate Uterine Fundus: firm Incision: healing well DVT Evaluation: No evidence of DVT seen on physical exam.  Recent Labs    02/17/21 0028 02/19/21 0459  HGB 11.1* 9.4*  HCT 34.0* 28.9*    Assessment/Plan: Status post Cesarean section. Doing well postoperatively.  Continue current care. Desires neonatal circumcision, R/B/A of procedure discussed at length. Pt understands that neonatal circumcision is not considered medically necessary and is elective. The risks include, but are not limited to bleeding, infection, damage to the penis, development of scar tissue, and having to have it redone at a later date. Pt understands theses risks and wishes to proceed Baby has yet to void and is feeding poorly.  Will postpone until tomorrow  Veronica Tran 02/19/2021, 1:18 PM

## 2021-02-20 DIAGNOSIS — Z98891 History of uterine scar from previous surgery: Secondary | ICD-10-CM

## 2021-02-20 MED ORDER — SUCROSE 24% NICU/PEDS ORAL SOLUTION: 0.5000 mL | OROMUCOSAL | Status: DC | PRN

## 2021-02-20 MED ORDER — IBUPROFEN 600 MG PO TABS: 600.0000 mg | ORAL_TABLET | Freq: Four times a day (QID) | ORAL | 1 refills | Status: AC | PRN

## 2021-02-20 MED ORDER — MEDROXYPROGESTERONE ACETATE 150 MG/ML IM SUSP: 150.0000 mg | Freq: Once | INTRAMUSCULAR | 3 refills | Status: AC

## 2021-02-20 MED ORDER — ACETAMINOPHEN FOR CIRCUMCISION 160 MG/5 ML
40.0000 mg | Freq: Once | ORAL | Status: DC
Start: 2021-02-20 — End: 2021-02-20

## 2021-02-20 MED ORDER — OXYCODONE-ACETAMINOPHEN 5-325 MG PO TABS: 1.0000 | ORAL_TABLET | Freq: Four times a day (QID) | ORAL | 0 refills | Status: AC | PRN

## 2021-02-20 MED ORDER — LIDOCAINE 1% INJECTION FOR CIRCUMCISION
0.8000 mL | INJECTION | Freq: Once | INTRAVENOUS | Status: DC
  Filled 2021-02-20: qty 1

## 2021-02-20 MED ORDER — EPINEPHRINE TOPICAL FOR CIRCUMCISION 0.1 MG/ML: 1.0000 [drp] | TOPICAL | Status: DC | PRN

## 2021-02-20 MED ORDER — ACETAMINOPHEN FOR CIRCUMCISION 160 MG/5 ML: 40.0000 mg | ORAL | Status: DC | PRN

## 2021-02-20 MED ORDER — ACETAMINOPHEN 325 MG PO TABS: 650.0000 mg | ORAL_TABLET | Freq: Four times a day (QID) | ORAL | 1 refills | Status: AC | PRN

## 2021-02-20 MED ORDER — WHITE PETROLATUM EX OINT: 1.0000 "application " | TOPICAL_OINTMENT | CUTANEOUS | Status: DC | PRN

## 2021-02-20 MED ORDER — SENNOSIDES-DOCUSATE SODIUM 8.6-50 MG PO TABS: 2.0000 | ORAL_TABLET | ORAL | 1 refills | Status: AC

## 2021-02-20 NOTE — Progress Notes (Addendum)
Subjective: Postpartum Day 2: Cesarean Delivery Patient reports: tolerating PO, pain well controlled with motrin/tylneol, requiring minimal oxycodone for pain management, no problems voiding. Desires discharge today if possible  Objective: Patient Vitals for the past 24 hrs:  BP Temp Temp src Pulse Resp SpO2  02/20/21 0517 107/80 97.7 F (36.5 C) Oral 75 16 99 %  02/19/21 2205 104/78 97.7 F (36.5 C) Oral 81 18 100 %  02/19/21 1334 112/81 97.7 F (36.5 C) Oral 72 18 100 %   Physical Exam:  General: alert and no distress Lochia: appropriate Uterine Fundus: firm Incision: clean and dry dressing. Instructed to remove dressing today DVT Evaluation: No evidence of DVT seen on physical exam.  Recent Labs    02/19/21 0459  HGB 9.4*  HCT 28.9*    Assessment/Plan: Status post Cesarean section. Doing well postoperatively. Plan for discharge this afternoon if baby appropriate for discharge Continue current care. Otelia Santee G1P1001 POD#2 sp CS at [redacted]w[redacted]d 1. ABLA: Hgb 11.1>9.4, plan on PO iron. EBL 245 2. Birth control: desires depo at pp visit, will send rx now so patient can bring to pp visit in 4 weeks 3. Desires neonatal circumcision, R/B/A of procedure discussed at length. Pt understands that neonatal circumcision is not considered medically necessary and is elective. The risks include, but are not limited to bleeding, infection, damage to the penis, development of scar tissue, and having to have it redone at a later date. Pt understands theses risks and wishes to proceed. Pediatrician noted mild bilateral hydrocele, ok for circ today.  4. Vaccines: UTD COVID series and booster, flu, tdap. Rubella immune, blood type O+, bottlefeeding, baby boy in room  Keyetta Hollingworth K Taam-Akelman 02/20/2021, 11:07 AM

## 2021-02-20 NOTE — Discharge Instructions (Signed)
Prescriptions Motrin 600mg  every 6 hours for pain Acetaminophen 650mg  every 6 hours for moderate pain Percocet (Oxycodone 5mg -acetaminophen 325mg ) every 4 hours for severe pain.  Make sure to not exceed acetaminophen 3000mg  every day.  Postpartum Care After Cesarean Delivery This sheet gives you information about how to care for yourself from the time you deliver your baby to up to 6-12 weeks after delivery (postpartum period). Your health care provider may also give you more specific instructions. If you have problems or questions, contact your health care provider. Follow these instructions at home: Medicines  Take over-the-counter and prescription medicines only as told by your health care provider.  If you were prescribed an antibiotic medicine, take it as told by your health care provider. Do not stop taking the antibiotic even if you start to feel better.  Ask your health care provider if the medicine prescribed to you: ? Requires you to avoid driving or using heavy machinery. ? Can cause constipation. You may need to take actions to prevent or treat constipation, such as:  Drink enough fluid to keep your urine pale yellow.  Take over-the-counter or prescription medicines.  Eat foods that are high in fiber, such as beans, whole grains, and fresh fruits and vegetables.  Limit foods that are high in fat and processed sugars, such as fried or sweet foods. Activity  Gradually return to your normal activities as told by your health care provider.  Avoid activities that take a lot of effort and energy (are strenuous) until approved by your health care provider. Walking at a slow to moderate pace is usually safe. Ask your health care provider what activities are safe for you. ? Do not lift anything that is heavier than your baby or 10 lb (4.5 kg) as told by your health care provider. ? Do not vacuum, climb stairs, or drive a car for as long as told by your health care provider.  If  possible, have someone help you at home until you are able to do your usual activities yourself.  Rest as much as possible. Try to rest or take naps while your baby is sleeping. Vaginal bleeding  It is normal to have vaginal bleeding (lochia) after delivery. Wear a sanitary pad to absorb vaginal bleeding and discharge. ? During the first week after delivery, the amount and appearance of lochia is often similar to a menstrual period. ? Over the next few weeks, it will gradually decrease to a dry, yellow-brown discharge. ? For most women, lochia stops completely by 4-6 weeks after delivery. Vaginal bleeding can vary from woman to woman.  Change your sanitary pads frequently. Watch for any changes in your flow, such as: ? A sudden increase in volume. ? A change in color. ? Large blood clots.  If you pass a blood clot, save it and call your health care provider to discuss. Do not flush blood clots down the toilet before you get instructions from your health care provider.  Do not use tampons or douches until your health care provider says this is safe.  If you are not breastfeeding, your period should return 6-8 weeks after delivery. If you are breastfeeding, your period may return anytime between 8 weeks after delivery and the time that you stop breastfeeding. Perineal care  If your C-section (Cesarean section) was unplanned, and you were allowed to labor and push before delivery, you may have pain, swelling, and discomfort of the tissue between your vaginal opening and your anus (perineum). You may  also have an incision in the tissue (episiotomy) or the tissue may have torn during delivery. Follow these instructions as told by your health care provider: ? Keep your perineum clean and dry as told by your health care provider. Use medicated pads and pain-relieving sprays and creams as directed. ? If you have an episiotomy or vaginal tear, check the area every day for signs of infection. Check  for:  Redness, swelling, or pain.  Fluid or blood.  Warmth.  Pus or a bad smell. ? You may be given a squirt bottle to use instead of wiping to clean the perineum area after you go to the bathroom. As you start healing, you may use the squirt bottle before wiping yourself. Make sure to wipe gently. ? To relieve pain caused by an episiotomy, vaginal tear, or hemorrhoids, try taking a warm sitz bath 2-3 times a day. A sitz bath is a warm water bath that is taken while you are sitting down. The water should only come up to your hips and should cover your buttocks.   Breast care  Within the first few days after delivery, your breasts may feel heavy, full, and uncomfortable (breast engorgement). You may also have milk leaking from your breasts. Your health care provider can suggest ways to help relieve breast discomfort. Breast engorgement should go away within a few days.  If you are breastfeeding: ? Wear a bra that supports your breasts and fits you well. ? Keep your nipples clean and dry. Apply creams and ointments as told by your health care provider. ? You may need to use breast pads to absorb milk leakage. ? You may have uterine contractions every time you breastfeed for several weeks after delivery. Uterine contractions help your uterus return to its normal size. ? If you have any problems with breastfeeding, work with your health care provider or a Advertising copywriter.  If you are not breastfeeding: ? Avoid touching your breasts as this can make your breasts produce more milk. ? Wear a well-fitting bra and use cold packs to help with swelling. ? Do not squeeze out (express) milk. This causes you to make more milk. Intimacy and sexuality  Ask your health care provider when you can engage in sexual activity. This may depend on your: ? Risk of infection. ? Healing rate. ? Comfort and desire to engage in sexual activity.  You are able to get pregnant after delivery, even if you have  not had your period. If desired, talk with your health care provider about methods of family planning or birth control (contraception). Lifestyle  Do not use any products that contain nicotine or tobacco, such as cigarettes, e-cigarettes, and chewing tobacco. If you need help quitting, ask your health care provider.  Do not drink alcohol, especially if you are breastfeeding. Eating and drinking  Drink enough fluid to keep your urine pale yellow.  Eat high-fiber foods every day. These may help prevent or relieve constipation. High-fiber foods include: ? Whole grain cereals and breads. ? Brown rice. ? Beans. ? Fresh fruits and vegetables.  Take your prenatal vitamins until your postpartum checkup or until your health care provider tells you it is okay to stop.   General instructions  Keep all follow-up visits for you and your baby as told by your health care provider. Most women visit their health care provider for a postpartum checkup within the first 3-6 weeks after delivery. Contact a health care provider if you:  Feel unable to cope  with the changes that a new baby brings to your life, and these feelings do not go away.  Feel unusually sad or worried.  Have breasts that are painful, hard, or turn red.  Have a fever.  Have trouble holding urine or keeping urine from leaking.  Have little or no interest in activities you used to enjoy.  Have not breastfed at all and you have not had a menstrual period for 12 weeks after delivery.  Have stopped breastfeeding and you have not had a menstrual period for 12 weeks after you stopped breastfeeding.  Have questions about caring for yourself or your baby.  Pass a blood clot from your vagina. Get help right away if you:  Have chest pain.  Have difficulty breathing.  Have sudden, severe leg pain.  Have severe pain or cramping in your abdomen.  Bleed from your vagina so much that you fill more than one sanitary pad in one hour.  Bleeding should not be heavier than your heaviest period.  Develop a severe headache.  Faint.  Have blurred vision or spots in your vision.  Have a bad-smelling vaginal discharge.  Have thoughts about hurting yourself or your baby. If you ever feel like you may hurt yourself or others, or have thoughts about taking your own life, get help right away. You can go to your nearest emergency department or call:  Your local emergency services (911 in the [place]  A suicide crisis helpline, such as the National Suicide Prevention Lifeline at 480 625 2813. This is open 24 hours a day. Summary  The period of time from when you deliver your baby to up to 6-12 weeks after delivery is called the postpartum period.  Gradually return to your normal activities as told by your health care provider.  Keep all follow-up visits for you and your baby as told by your health care provider. This information is not intended to replace advice given to you by your health care provider. Make sure you discuss any questions you have with your health care provider. Document Revised: 06/07/2018 Document Reviewed: 06/07/2018 Elsevier Patient Education  2021 [employer].

## 2021-02-20 NOTE — Discharge Summary (Signed)
Postpartum Discharge Summary    Patient Name: Veronica Tran DOB: 1996/04/09 MRN: [id number]  Date of admission: 02/17/2021 Delivery date:02/18/2021  Delivering provider: Irene Pap E  Date of discharge: 02/20/2021  Admitting diagnosis: Term pregnancy [Z34.90] Intrauterine pregnancy: [redacted]w[redacted]d    Secondary diagnosis:  Active Problems:   Term pregnancy   S/P C-section  Additional problems: ABLA    Discharge diagnosis: Term Pregnancy Delivered                                              Post partum procedures:none Augmentation: AROM, Pitocin, Cytotec and IP Foley Complications: None  Hospital course: Induction of Labor With Cesarean Section   25y.o. yo G1P1001 at 470w0das admitted to the hospital 02/17/2021 for induction of labor. Patient had a labor course significant for non reassuring fetal status and arrest of dilation at 6cm. The patient went for cesarean section due to Non-Reassuring FHR. Delivery details are as follows: Membrane Rupture Time/Date: 10:03 AM ,02/18/2021   Delivery Method:C-Section, Low Transverse  Details of operation can be found in separate operative Note.  Patient had an uncomplicated postpartum course. She is ambulating, tolerating a regular diet, passing flatus, and urinating well.  Patient is discharged home in stable condition on 02/20/21.      Newborn Data: Birth date:02/18/2021  Birth time:4:58 PM  Gender:Female  Living status:Living  Apgars:3 ,5  Weight:2690 g                                 Magnesium Sulfate received: No BMZ received: No Rhophylac:N/A MMR:N/A T-DaP:Given prenatally FlWYO:VZCHYrenatally Transfusion:No  Physical exam  Vitals:   02/19/21 0812 02/19/21 1334 02/19/21 2205 02/20/21 0517  BP:  112/81 104/78 107/80  Pulse:  72 81 75  Resp:  18 18 16   Temp: 98.4 F (36.9 C) 97.7 F (36.5 C) 97.7 F (36.5 C) 97.7 F (36.5 C)  TempSrc:  Oral Oral Oral  SpO2: 97% 100% 100% 99%  Weight:      Height:       General:  alert and no distress Lochia: appropriate Uterine Fundus: firm Incision: clean and dry dressing. Instructed to remove dressing today DVT Evaluation: No evidence of DVT seen on physical exam.  Labs: Lab Results  Component Value Date   WBC 27.2 (H) 02/19/2021   HGB 9.4 (L) 02/19/2021   HCT 28.9 (L) 02/19/2021   MCV 85.0 02/19/2021   PLT 269 02/19/2021   CMP Latest Ref Rng & Units 08/14/2020  Glucose 70 - 99 mg/dL 87  BUN 6 - 20 mg/dL 5(L)  Creatinine 0.44 - 1.00 mg/dL 0.61  Sodium 135 - 145 mmol/L 137  Potassium 3.5 - 5.1 mmol/L 3.1(L)  Chloride 98 - 111 mmol/L 100  CO2 22 - 32 mmol/L 23  Calcium 8.9 - 10.3 mg/dL 9.4  Total Protein 6.0 - 8.3 g/dL -  Total Bilirubin 0.2 - 1.2 mg/dL -  Alkaline Phos 39 - 117 U/L -  AST 0 - 37 U/L -  ALT 0 - 35 U/L -   Edinburgh Score: No flowsheet data found.    After visit meds:  Allergies as of 02/20/2021   No Known Allergies     Medication List    TAKE these medications   acetaminophen 325 MG tablet  Commonly known as: TYLENOL Take 2 tablets (650 mg total) by mouth every 6 (six) hours as needed.   famotidine 20 MG tablet Commonly known as: PEPCID Take 1 tablet (20 mg total) by mouth 2 (two) times daily.   glycopyrrolate 1 MG tablet Commonly known as: Robinul Take 1 tablet (1 mg total) by mouth 3 (three) times daily as needed.   ibuprofen 600 MG tablet Commonly known as: ADVIL Take 1 tablet (600 mg total) by mouth every 6 (six) hours as needed.   medroxyPROGESTERone 150 MG/ML injection Commonly known as: DEPO-PROVERA Inject 1 mL (150 mg total) into the muscle once for 1 dose.   multivitamin-prenatal 27-0.8 MG Tabs tablet Take 1 tablet by mouth daily at 12 noon.   ondansetron 8 MG disintegrating tablet Commonly known as: ZOFRAN-ODT Take 8 mg by mouth every 8 (eight) hours as needed.   oxyCODONE-acetaminophen 5-325 MG tablet Commonly known as: Percocet Take 1 tablet by mouth every 6 (six) hours as needed for severe  pain.   promethazine 25 MG tablet Commonly known as: PHENERGAN Take 0.5-1 tablets (12.5-25 mg total) by mouth every 6 (six) hours as needed for nausea or vomiting.   senna-docusate 8.6-50 MG tablet Commonly known as: Senokot-S Take 2 tablets by mouth daily.            Discharge Care Instructions  (From admission, onward)         Start     Ordered   02/20/21 0000  No dressing needed        02/20/21 1108           Discharge home in stable condition Infant Feeding: Bottle Infant Disposition:home with mother Discharge instruction: per After Visit Summary and Postpartum booklet. Activity: Advance as tolerated. Pelvic rest for 6 weeks.  Diet: routine diet Anticipated Birth Control: Depo, prescribed at discharge to be given at 4w pp visit Postpartum Appointment:4 weeks Additional Postpartum F/U: none Future Appointments: Future Appointments  Date Time Provider [place]  02/23/2021  9:55 AM [facility] None   Follow up Visit:  Follow-up Information    Rowland Lathe, MD. Schedule an appointment as soon as possible for a visit in 4 week(s).   Specialty: Obstetrics and Gynecology Contact information: [address] [place] [address] [phone]                   02/20/2021 Jonelle Sidle, MD

## 2021-02-23 ENCOUNTER — Other Ambulatory Visit (HOSPITAL_COMMUNITY): Payer: 59 | Attending: Obstetrics & Gynecology

## 2021-02-25 ENCOUNTER — Inpatient Hospital Stay (HOSPITAL_COMMUNITY): Payer: 59

## 2021-03-27 IMAGING — US US MFM OB DETAIL+14 WK
1 series · 13 of 28 positions shown · non-contrast
Comparison: none

[Series 1: us mfm ob detail+14 wk · 104 acquisitions, 13 frames shown]
[im 4/104]
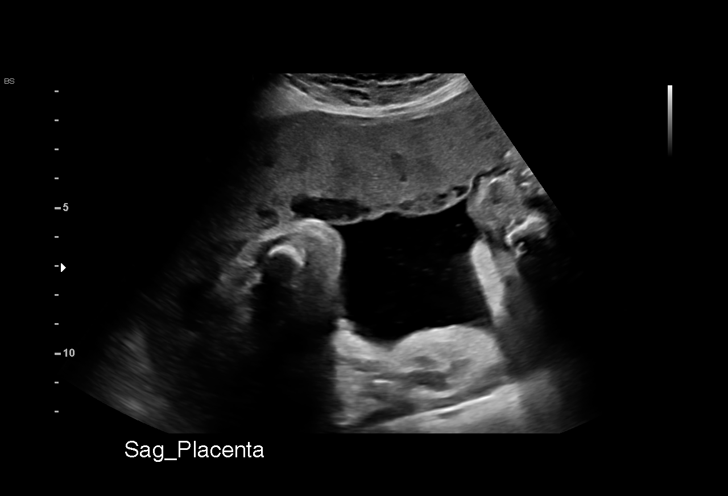
[im 12/104]
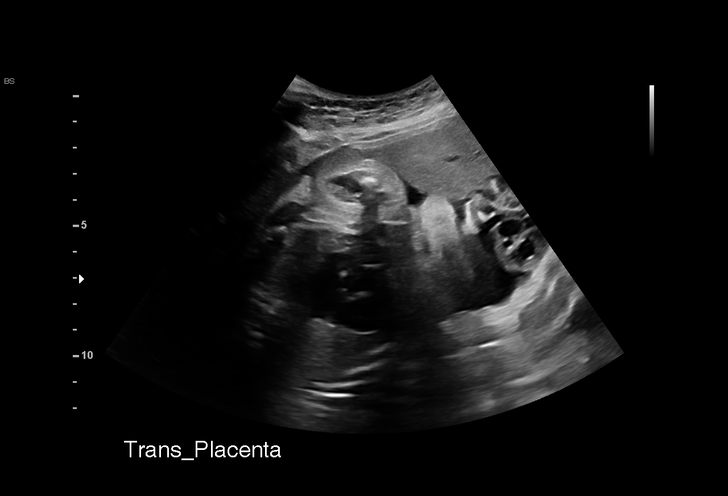
[im 20/104]
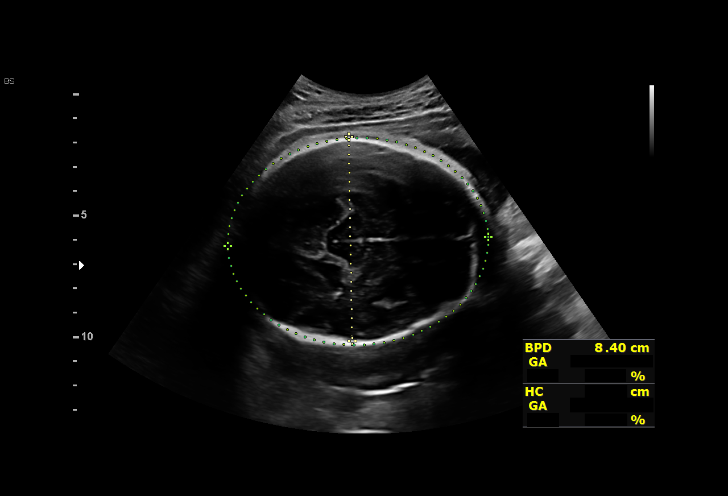
[im 27/104]
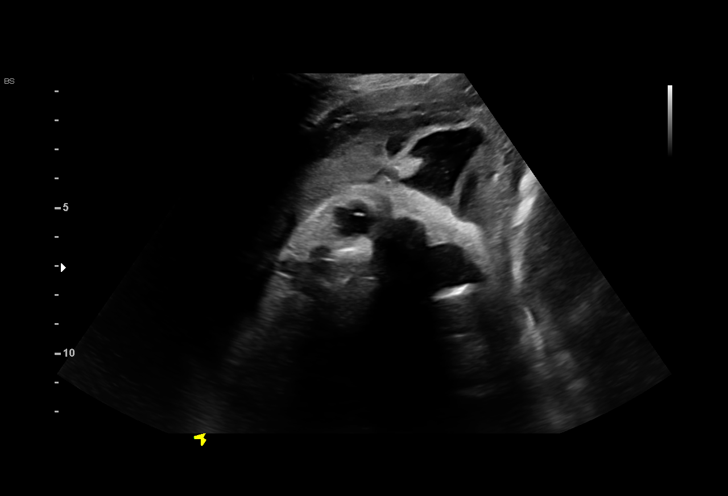
[im 35/104]
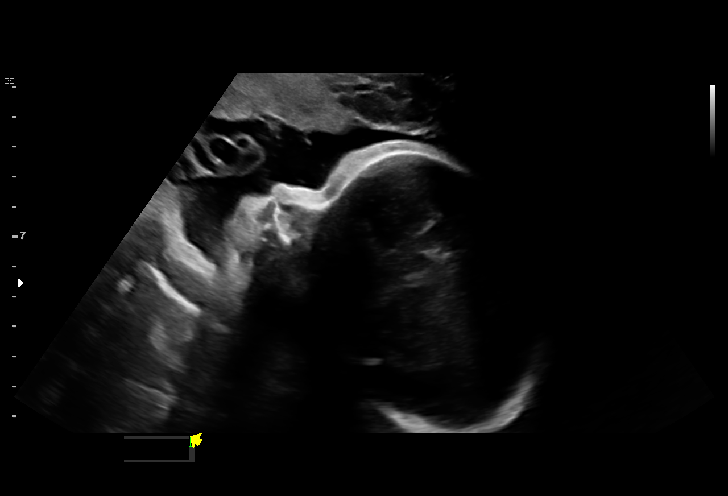
[im 42/104]
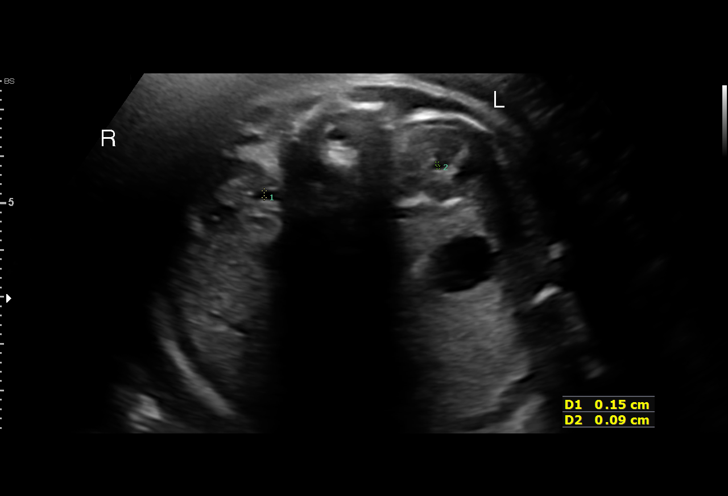
[im 54/104]
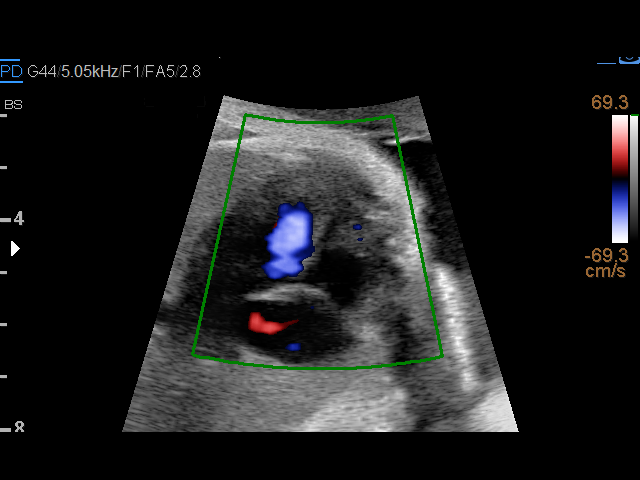
[im 62/104]
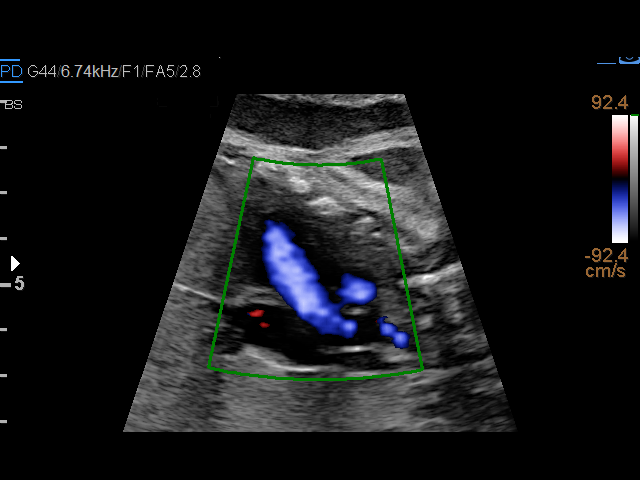
[im 69/104]
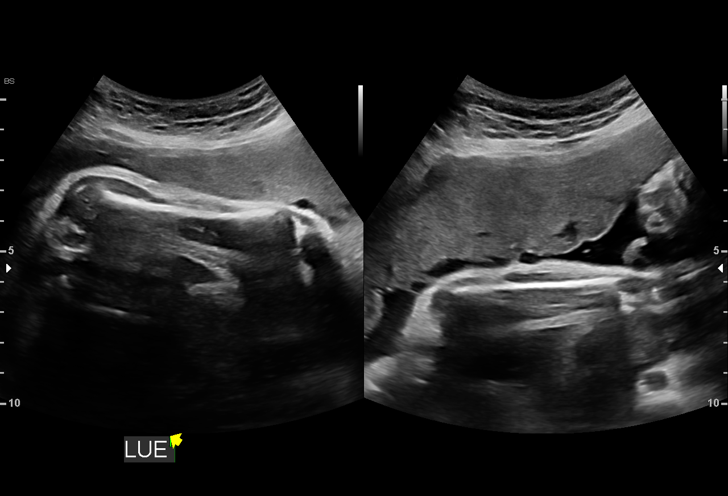
[im 77/104]
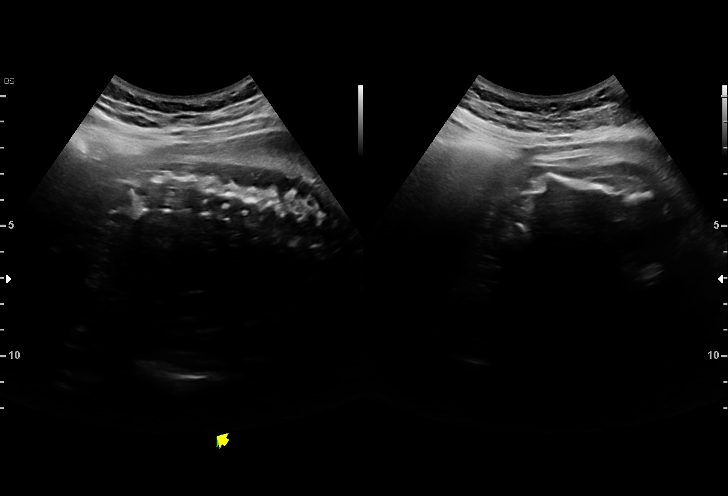
[im 84/104]
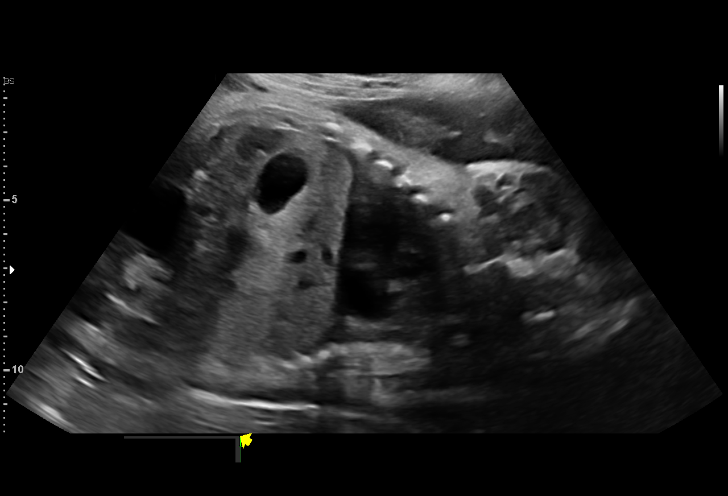
[im 92/104]
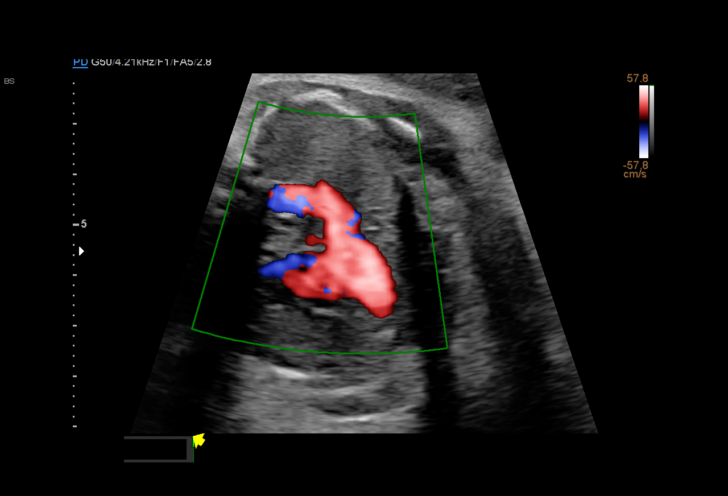
[im 100/104]
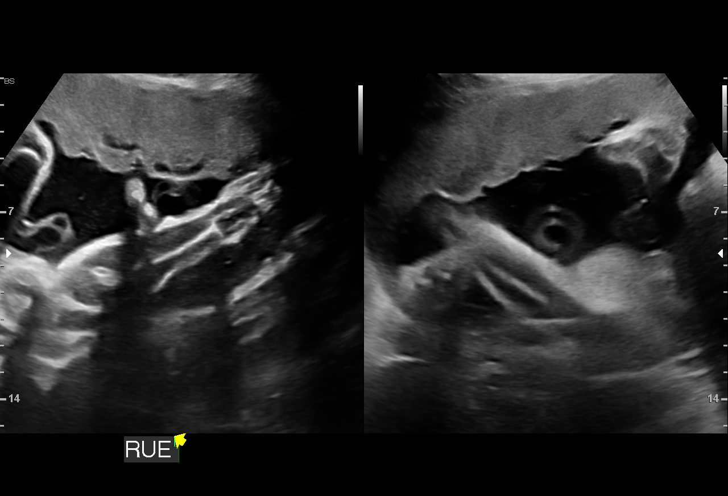

[13 of 28 positions shown; findings below may reference images not displayed]

OBGYN

 2  US MFM UA CORD DOPPLER                76820.02    OSNIEL RAYNOR

Indications

 33 weeks gestation of pregnancy
 Encounter for antenatal screening for
 malformations (LR NIPS,Declined AFP)
 Maternal care for known or suspected poor
 fetal growth, third trimester, not applicable or
 unspecified IUGR
 Bicornuate uterus, 3RD trimester
Fetal Evaluation

 Num Of Fetuses:         1
 Fetal Heart Rate(bpm):  136
 Cardiac Activity:       Observed
 Presentation:           Cephalic
 Placenta:               Anterior
 P. Cord Insertion:      Visualized

 Amniotic Fluid
 AFI FV:      Subjectively low-normal

 AFI Sum(cm)     %Tile       Largest Pocket(cm)

 RUQ(cm)       RLQ(cm)       LUQ(cm)        LLQ(cm)

Biometry

 BPD:      84.3  mm     G. Age:  34w 0d         65  %    CI:         77.6   %    70 - 86
                                                         FL/HC:      20.3   %    19.9 -
 HC:      302.9  mm     G. Age:  33w 5d         22  %    HC/AC:      1.05        0.96 -
 AC:      289.3  mm     G. Age:  33w 0d         41  %    FL/BPD:     73.1   %    71 - 87
 FL:       61.6  mm     G. Age:  32w 0d         11  %    FL/AC:      21.3   %    20 - 24
 HUM:      56.7  mm     G. Age:  33w 0d         51  %
 CER:      44.7  mm     G. Age:  34w 5d         59  %
 LV:        2.4  mm
 CM:        6.3  mm

 Est. FW:    2555  gm      4 lb 8 oz     28  %
OB History

 Gravidity:    1
 Living:       0
Gestational Age

 LMP:           33w 2d        Date:  05/14/20                 EDD:   02/18/21
 U/S Today:     33w 1d                                        EDD:   02/19/21
 Best:          33w 2d     Det. By:  LMP  (05/14/20)          EDD:   02/18/21
Anatomy

 Cranium:               Appears normal         Aortic Arch:            Appears normal
 Cavum:                 Appears normal         Ductal Arch:            Not well visualized
 Ventricles:            Appears normal         Diaphragm:              Appears normal
 Choroid Plexus:        Appears normal         Stomach:                Appears normal, left
                                                                       sided
 Cerebellum:            Appears normal         Abdomen:                Appears normal
 Posterior Fossa:       Appears normal         Abdominal Wall:         Not well visualized
 Nuchal Fold:           Not applicable (>20    Cord Vessels:           Appears normal (3
                        wks GA)                                        vessel cord)
 Face:                  Appears normal         Kidneys:                Appear normal
                        (orbits and profile)
 Lips:                  Appears normal         Bladder:                Appears normal
 Thoracic:              Appears normal         Spine:                  Ltd views no
                                                                       intracranial signs of
                                                                       NTD
 Heart:                 Appears normal         Upper Extremities:      Visualized
                        (4CH, axis, and
                        situs)
 RVOT:                  Not well visualized    Lower Extremities:      Visualized
 LVOT:                  Appears normal

 Other:  Technically difficult due to advanced gestational age.
Doppler - Fetal Vessels

 Umbilical Artery
  S/D     %tile      RI    %tile      PI    %tile     PSV    ADFV    RDFV
                                                    (cm/s)
  3.02       75    0.67       79    1.[REDACTED]      No      No
Cervix Uterus Adnexa

 Cervix
 Not visualized (advanced GA >55wks)

 Uterus
 No abnormality visualized.

 Right Ovary
 Not visualized.

 Left Ovary
 Not visualized.

 Cul De Sac
 No free fluid seen.

 Adnexa
 No adnexal mass visualized.
Impression

 G1 P0. Patient is here for a second opinion ultrasound. On
 your office ultrasound, fetal growth restriction was suspected.
 On cell free fetal DNA screening, the risks of fetal
 aneuploidies are not increased. Patient reports she does not
 have gestational diabetes. Her blood pressures have been
 normal at prenatal visits.

 On today's ultrasound, the estimated fetal weight is at the
 28th percentile. Abdominal and head circumference
 measurements are within normal range. Amniotic fluid is
 normal and good fetal activity seen. Umbilical artery Doppler
 showed normal forward diastolic flow.

 I reassured the patient that fetal growth is appropriate for
 gestational age. Patient was made aware that discrepancy
 and estimation of fetal weights can occur at different
 institutions based on the formula used.

Recommendations

 -An appointment was made for her to return in 3 weeks for
 fetal growth assessment (for reassurance).
                      [place], [place]

## 2022-12-06 ENCOUNTER — Emergency Department (HOSPITAL_COMMUNITY)
Admission: EM | Admit: 2022-12-06 | Discharge: 2022-12-06 | Disposition: A | Discharge status: Home / Self Care | Payer: Medicaid Other | Attending: Emergency Medicine | Admitting: Emergency Medicine

## 2022-12-06 ENCOUNTER — Encounter (HOSPITAL_COMMUNITY): Payer: Self-pay

## 2022-12-06 DIAGNOSIS — R55 Syncope and collapse: Secondary | ICD-10-CM | POA: Insufficient documentation

## 2022-12-06 DIAGNOSIS — J45909 Unspecified asthma, uncomplicated: Secondary | ICD-10-CM | POA: Insufficient documentation

## 2022-12-06 DIAGNOSIS — E860 Dehydration: Secondary | ICD-10-CM | POA: Diagnosis not present

## 2022-12-06 DIAGNOSIS — R58 Hemorrhage, not elsewhere classified: Secondary | ICD-10-CM | POA: Diagnosis not present

## 2022-12-06 LAB — BASIC METABOLIC PANEL
Anion gap: 9 (ref 5–15)
BUN: 6 mg/dL (ref 6–20)
CO2: 19 mmol/L — ABNORMAL LOW (ref 22–32)
Calcium: 8.7 mg/dL — ABNORMAL LOW (ref 8.9–10.3)
Chloride: 107 mmol/L (ref 98–111)
Creatinine, Ser: 0.59 mg/dL (ref 0.44–1.00)
GFR, Estimated: >60 mL/min (ref >60)
Glucose, Bld: 98 mg/dL (ref 70–99)
Potassium: 4 mmol/L (ref 3.5–5.1)
Sodium: 135 mmol/L (ref 135–145)

## 2022-12-06 LAB — CBC
HCT: 36.2 % (ref 36.0–46.0)
Hemoglobin: 11.9 g/dL — ABNORMAL LOW (ref 12.0–15.0)
MCH: 27.9 pg (ref 26.0–34.0)
MCHC: 32.9 g/dL (ref 30.0–36.0)
MCV: 85 fL (ref 80.0–100.0)
Platelets: 292 10*3/uL (ref 150–400)
RBC: 4.26 MIL/uL (ref 3.87–5.11)
RDW: 13.6 % (ref 11.5–15.5)
WBC: 8.9 10*3/uL (ref 4.0–10.5)
nRBC: 0 % (ref 0.0–0.2)

## 2022-12-06 NOTE — ED Provider Notes (Signed)
[facility] Provider Note   CSN: [id number] Arrival date & time: 12/06/22  1132     History  Chief Complaint  Patient presents with   Near Syncope    Veronica Tran is a 27 y.o. female with past medical history significant for childhood asthma, remote history of anemia secondary to heavy menses who presents with concern for near syncope.  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  She did not actually fall down.  She reports that she did feel somewhat woozy around the event but since then has recovered.  She reports is the first day of her current menstrual cycle, with normal timing, and flow similar to previous.  She denies any chest pain, feeling of heart racing, shortness of breath prior to passing out.  She denies any alcohol, drug use.  She reports that she had only had a glass of water so far to eat or drink today.   Near Syncope       Home Medications Prior to Admission medications   Medication Sig Start Date End Date Taking? Authorizing Provider  acetaminophen (TYLENOL) 325 MG tablet Take 2 tablets (650 mg total) by mouth every 6 (six) hours as needed. 02/20/21   Taam-Akelman, Lawrence Santiago, MD  famotidine (PEPCID) 20 MG tablet Take 1 tablet (20 mg total) by mouth 2 (two) times daily. 08/10/20 09/09/20  Julianne Handler, CNM  glycopyrrolate (ROBINUL) 1 MG tablet Take 1 tablet (1 mg total) by mouth 3 (three) times daily as needed. Patient not taking: No sig reported 08/10/20   Julianne Handler, CNM  ibuprofen (ADVIL) 600 MG tablet Take 1 tablet (600 mg total) by mouth every 6 (six) hours as needed. 02/20/21   Jonelle Sidle, MD  medroxyPROGESTERone (DEPO-PROVERA) 150 MG/ML injection Inject 1 mL (150 mg total) into the muscle once for 1 dose. 02/20/21 02/20/21  Jonelle Sidle, MD  ondansetron (ZOFRAN-ODT) 8 MG disintegrating  tablet Take 8 mg by mouth every 8 (eight) hours as needed. Patient not taking: No sig reported 07/19/20   [provider]  oxyCODONE-acetaminophen (PERCOCET) 5-325 MG tablet Take 1 tablet by mouth every 6 (six) hours as needed for severe pain. 02/20/21   Taam-Akelman, Lawrence Santiago, MD  Prenatal Vit-Fe Fumarate-FA (MULTIVITAMIN-PRENATAL) 27-0.8 MG TABS tablet Take 1 tablet by mouth daily at 12 noon.    [provider]  promethazine (PHENERGAN) 25 MG tablet Take 0.5-1 tablets (12.5-25 mg total) by mouth every 6 (six) hours as needed for nausea or vomiting. Patient not taking: No sig reported 08/10/20   Julianne Handler, CNM  senna-docusate (SENOKOT-S) 8.6-50 MG tablet Take 2 tablets by mouth daily. 02/20/21   Jonelle Sidle, MD      Allergies    Patient has no known allergies.    Review of Systems   Review of Systems  Cardiovascular:  Positive for near-syncope.  All other systems reviewed and are negative.   Physical Exam Updated Vital Signs BP 112/69 (BP Location: Right Arm)   Pulse 82   Temp 98.3 F (36.8 C) (Oral)   Resp 18   SpO2 100%  Physical Exam Vitals and nursing note reviewed.  Constitutional:      General: She is not in acute distress.    Appearance: Normal appearance.  HENT:     Head: Normocephalic and atraumatic.  Eyes:  General:        Right eye: No discharge.        Left eye: No discharge.  Cardiovascular:     Rate and Rhythm: Normal rate and regular rhythm.  Pulmonary:     Effort: Pulmonary effort is normal. No respiratory distress.  Musculoskeletal:        General: No deformity.  Skin:    General: Skin is warm and dry.  Neurological:     Mental Status: She is alert and oriented to person, place, and time.     Comments: Moves all 4 limbs spontaneously, CN II through XII grossly intact, can ambulate without difficulty, intact sensation throughout.   Psychiatric:        Mood and Affect: Mood normal.        Behavior: Behavior  normal.     ED Results / Procedures / Treatments   Labs (all labs ordered are listed, but only abnormal results are displayed) Labs Reviewed  CBC - Abnormal; Notable for the following components:      Result Value   Hemoglobin 11.9 (*)    All other components within normal limits  BASIC METABOLIC PANEL - Abnormal; Notable for the following components:   CO2 19 (*)    Calcium 8.7 (*)    All other components within normal limits    EKG None  Radiology No results found.  Procedures Procedures    Medications Ordered in ED Medications - No data to display  ED Course/ Medical Decision Making/ A&P                             Medical Decision Making Amount and/or Complexity of Data Reviewed Labs: ordered.   This patient is a 27 y.o. female  who presents to the ED for concern of near syncope  Differential diagnoses prior to evaluation: The emergent differential diagnosis includes, but is not limited to,  CVA, ACS, arrhythmia, vasovagal syncope, orthostatic hypotension, sepsis, hypoglycemia, electrolyte disturbance, respiratory failure, symptomatic anemia, dehydration, heat injury, polypharmacy, malignancy, anxiety/panic attack. .  . This is not an exhaustive differential.   Past Medical History / Co-morbidities: anemia  Physical Exam: Physical exam performed. The pertinent findings include: Patient with no focal neurologic deficits, she has normal heart rate and rhythm on my exam, she has no acute distress.  Lab Tests/Imaging studies: I personally interpreted labs/imaging and the pertinent results include: CBC does show very mild anemia, hemoglobin 11.9, BMP notable for mild bicarb deficit, CO2 19, no anion gap.  No other significant electrolyte abnormalities.  Cardiac monitoring: EKG obtained and interpreted by my attending physician which shows: Normal sinus rhythm   Disposition: After consideration of the diagnostic results and the patients response to treatment, I  feel that patient's symptoms seem related to syncope secondary to dehydration, lack of oral intake versus other, low clinical suspicion for cardiogenic, neurogenic syncope.  No evidence of severe anemia, although given heavy menses patient may be slightly volume down compared to her normal which may have exacerbated and precipitated her syncope..   emergency department workup does not suggest an emergent condition requiring admission or immediate intervention beyond what has been performed at this time. The plan is: as above. The patient is safe for discharge and has been instructed to return immediately for worsening symptoms, change in symptoms or any other concerns.  Final Clinical Impression(s) / ED Diagnoses Final diagnoses:  Near syncope  Dehydration    Rx /  DC Orders ED Discharge Orders     None         Dorien Chihuahua 12/06/22 1317    Varney Biles, MD 12/07/22 [phone]

## 2022-12-06 NOTE — ED Triage Notes (Signed)
Pt BIB [facility] from work with c/o near syncope. 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 She denies LOC, falling. Pt has hx of anemia and is on her first day of her menstrual cycle. Reports that she does not take supplemental iron.   BP 124/82 HR 90 99% RA  CBG 122

## 2023-11-27 DIAGNOSIS — Z3A01 Less than 8 weeks gestation of pregnancy: Secondary | ICD-10-CM | POA: Diagnosis not present

## 2023-11-27 DIAGNOSIS — O219 Vomiting of pregnancy, unspecified: Secondary | ICD-10-CM | POA: Diagnosis not present

## 2023-11-27 DIAGNOSIS — R059 Cough, unspecified: Secondary | ICD-10-CM | POA: Diagnosis not present

## 2024-06-19 DIAGNOSIS — Z3A10 10 weeks gestation of pregnancy: Secondary | ICD-10-CM | POA: Diagnosis not present

## 2024-06-19 DIAGNOSIS — O219 Vomiting of pregnancy, unspecified: Secondary | ICD-10-CM | POA: Diagnosis not present

## 2024-06-19 DIAGNOSIS — R42 Dizziness and giddiness: Secondary | ICD-10-CM | POA: Diagnosis not present

## 2024-07-12 DIAGNOSIS — O34211 Maternal care for low transverse scar from previous cesarean delivery: Secondary | ICD-10-CM | POA: Diagnosis not present

## 2024-07-12 DIAGNOSIS — Z3A15 15 weeks gestation of pregnancy: Secondary | ICD-10-CM | POA: Diagnosis not present

## 2024-07-12 DIAGNOSIS — Q513 Bicornate uterus: Secondary | ICD-10-CM | POA: Diagnosis not present

## 2024-07-12 DIAGNOSIS — O3402 Maternal care for unspecified congenital malformation of uterus, second trimester: Secondary | ICD-10-CM | POA: Diagnosis not present

## 2024-08-09 DIAGNOSIS — Z369 Encounter for antenatal screening, unspecified: Secondary | ICD-10-CM | POA: Diagnosis not present

## 2024-08-09 DIAGNOSIS — Z3A20 20 weeks gestation of pregnancy: Secondary | ICD-10-CM | POA: Diagnosis not present

## 2024-10-11 DIAGNOSIS — Z3689 Encounter for other specified antenatal screening: Secondary | ICD-10-CM | POA: Diagnosis not present

## 2024-10-11 DIAGNOSIS — Z3A28 28 weeks gestation of pregnancy: Secondary | ICD-10-CM | POA: Diagnosis not present
